# Patient Record
Sex: Male | Born: 1995 | Race: Black or African American | Hispanic: No | Marital: Single | Smoking: Never smoker
Health system: Southern US, Community
[De-identification: ages and names within clinical notes are randomized; demographics above are authoritative.]

## PROBLEM LIST (undated history)

## (undated) DIAGNOSIS — F329 Major depressive disorder, single episode, unspecified: Secondary | ICD-10-CM

## (undated) DIAGNOSIS — F4320 Adjustment disorder, unspecified: Secondary | ICD-10-CM

## (undated) DIAGNOSIS — F32A Depression, unspecified: Secondary | ICD-10-CM

## (undated) DIAGNOSIS — F419 Anxiety disorder, unspecified: Secondary | ICD-10-CM

## (undated) DIAGNOSIS — L83 Acanthosis nigricans: Secondary | ICD-10-CM

## (undated) DIAGNOSIS — F909 Attention-deficit hyperactivity disorder, unspecified type: Secondary | ICD-10-CM

## (undated) DIAGNOSIS — L732 Hidradenitis suppurativa: Secondary | ICD-10-CM

## (undated) HISTORY — PX: OTHER SURGICAL HISTORY: SHX169

---
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
## 2015-12-31 DIAGNOSIS — H5211 Myopia, right eye: Secondary | ICD-10-CM | POA: Diagnosis not present

## 2016-02-03 DIAGNOSIS — K649 Unspecified hemorrhoids: Secondary | ICD-10-CM | POA: Diagnosis not present

## 2016-02-03 DIAGNOSIS — K6289 Other specified diseases of anus and rectum: Secondary | ICD-10-CM | POA: Diagnosis not present

## 2016-03-22 DIAGNOSIS — R251 Tremor, unspecified: Secondary | ICD-10-CM | POA: Diagnosis not present

## 2016-03-22 DIAGNOSIS — L409 Psoriasis, unspecified: Secondary | ICD-10-CM | POA: Diagnosis not present

## 2016-03-22 DIAGNOSIS — Z6820 Body mass index (BMI) 20.0-20.9, adult: Secondary | ICD-10-CM | POA: Diagnosis not present

## 2016-04-02 DIAGNOSIS — N481 Balanitis: Secondary | ICD-10-CM | POA: Diagnosis not present

## 2016-06-02 DIAGNOSIS — R079 Chest pain, unspecified: Secondary | ICD-10-CM | POA: Diagnosis not present

## 2016-06-02 DIAGNOSIS — E860 Dehydration: Secondary | ICD-10-CM | POA: Diagnosis not present

## 2016-06-02 DIAGNOSIS — R197 Diarrhea, unspecified: Secondary | ICD-10-CM | POA: Diagnosis not present

## 2016-06-02 DIAGNOSIS — R112 Nausea with vomiting, unspecified: Secondary | ICD-10-CM | POA: Diagnosis not present

## 2016-07-02 DIAGNOSIS — J309 Allergic rhinitis, unspecified: Secondary | ICD-10-CM | POA: Diagnosis not present

## 2016-07-02 DIAGNOSIS — R5383 Other fatigue: Secondary | ICD-10-CM | POA: Diagnosis not present

## 2016-07-05 DIAGNOSIS — R143 Flatulence: Secondary | ICD-10-CM | POA: Diagnosis not present

## 2016-08-16 DIAGNOSIS — R51 Headache: Secondary | ICD-10-CM | POA: Diagnosis not present

## 2016-08-16 DIAGNOSIS — Z0000 Encounter for general adult medical examination without abnormal findings: Secondary | ICD-10-CM | POA: Diagnosis not present

## 2016-08-16 DIAGNOSIS — R109 Unspecified abdominal pain: Secondary | ICD-10-CM | POA: Diagnosis not present

## 2016-08-16 DIAGNOSIS — F4320 Adjustment disorder, unspecified: Secondary | ICD-10-CM | POA: Diagnosis not present

## 2016-08-17 DIAGNOSIS — R109 Unspecified abdominal pain: Secondary | ICD-10-CM | POA: Diagnosis not present

## 2016-08-17 DIAGNOSIS — Z0000 Encounter for general adult medical examination without abnormal findings: Secondary | ICD-10-CM | POA: Diagnosis not present

## 2016-10-27 DIAGNOSIS — F4329 Adjustment disorder with other symptoms: Secondary | ICD-10-CM | POA: Diagnosis not present

## 2016-10-27 DIAGNOSIS — F4320 Adjustment disorder, unspecified: Secondary | ICD-10-CM | POA: Insufficient documentation

## 2016-11-24 DIAGNOSIS — F4329 Adjustment disorder with other symptoms: Secondary | ICD-10-CM | POA: Diagnosis not present

## 2016-12-20 DIAGNOSIS — N419 Inflammatory disease of prostate, unspecified: Secondary | ICD-10-CM | POA: Diagnosis not present

## 2016-12-20 DIAGNOSIS — R109 Unspecified abdominal pain: Secondary | ICD-10-CM | POA: Diagnosis not present

## 2016-12-20 DIAGNOSIS — R0602 Shortness of breath: Secondary | ICD-10-CM | POA: Diagnosis not present

## 2016-12-20 DIAGNOSIS — L309 Dermatitis, unspecified: Secondary | ICD-10-CM | POA: Diagnosis not present

## 2016-12-29 DIAGNOSIS — B3783 Candidal cheilitis: Secondary | ICD-10-CM | POA: Diagnosis not present

## 2016-12-29 DIAGNOSIS — L2089 Other atopic dermatitis: Secondary | ICD-10-CM | POA: Diagnosis not present

## 2016-12-29 DIAGNOSIS — L209 Atopic dermatitis, unspecified: Secondary | ICD-10-CM | POA: Insufficient documentation

## 2016-12-29 DIAGNOSIS — D485 Neoplasm of uncertain behavior of skin: Secondary | ICD-10-CM | POA: Diagnosis not present

## 2016-12-31 DIAGNOSIS — Z719 Counseling, unspecified: Secondary | ICD-10-CM | POA: Diagnosis not present

## 2016-12-31 DIAGNOSIS — R140 Abdominal distension (gaseous): Secondary | ICD-10-CM | POA: Diagnosis not present

## 2017-01-11 DIAGNOSIS — K449 Diaphragmatic hernia without obstruction or gangrene: Secondary | ICD-10-CM | POA: Diagnosis not present

## 2017-01-11 DIAGNOSIS — R198 Other specified symptoms and signs involving the digestive system and abdomen: Secondary | ICD-10-CM | POA: Diagnosis not present

## 2017-03-14 DIAGNOSIS — R1084 Generalized abdominal pain: Secondary | ICD-10-CM | POA: Diagnosis not present

## 2017-03-14 DIAGNOSIS — R112 Nausea with vomiting, unspecified: Secondary | ICD-10-CM | POA: Diagnosis not present

## 2017-03-29 DIAGNOSIS — J029 Acute pharyngitis, unspecified: Secondary | ICD-10-CM | POA: Diagnosis not present

## 2017-03-29 DIAGNOSIS — J00 Acute nasopharyngitis [common cold]: Secondary | ICD-10-CM | POA: Diagnosis not present

## 2017-04-19 DIAGNOSIS — K219 Gastro-esophageal reflux disease without esophagitis: Secondary | ICD-10-CM | POA: Diagnosis not present

## 2017-04-28 DIAGNOSIS — K219 Gastro-esophageal reflux disease without esophagitis: Secondary | ICD-10-CM | POA: Insufficient documentation

## 2017-05-06 DIAGNOSIS — K0889 Other specified disorders of teeth and supporting structures: Secondary | ICD-10-CM | POA: Diagnosis not present

## 2017-05-06 DIAGNOSIS — Z6826 Body mass index (BMI) 26.0-26.9, adult: Secondary | ICD-10-CM | POA: Diagnosis not present

## 2017-05-16 DIAGNOSIS — K219 Gastro-esophageal reflux disease without esophagitis: Secondary | ICD-10-CM | POA: Diagnosis not present

## 2017-05-17 DIAGNOSIS — R1084 Generalized abdominal pain: Secondary | ICD-10-CM | POA: Diagnosis not present

## 2017-05-17 DIAGNOSIS — E860 Dehydration: Secondary | ICD-10-CM | POA: Diagnosis not present

## 2017-05-17 DIAGNOSIS — K529 Noninfective gastroenteritis and colitis, unspecified: Secondary | ICD-10-CM | POA: Diagnosis not present

## 2017-05-18 ENCOUNTER — Emergency Department (HOSPITAL_COMMUNITY)
Admission: EM | Admit: 2017-05-18 | Discharge: 2017-05-18 | Disposition: A | Discharge status: Home / Self Care | Payer: BLUE CROSS/BLUE SHIELD | Attending: Emergency Medicine | Admitting: Emergency Medicine

## 2017-05-18 DIAGNOSIS — R45851 Suicidal ideations: Secondary | ICD-10-CM | POA: Insufficient documentation

## 2017-05-18 DIAGNOSIS — F4320 Adjustment disorder, unspecified: Secondary | ICD-10-CM | POA: Insufficient documentation

## 2017-05-18 DIAGNOSIS — Z79899 Other long term (current) drug therapy: Secondary | ICD-10-CM | POA: Diagnosis not present

## 2017-05-18 DIAGNOSIS — F4323 Adjustment disorder with mixed anxiety and depressed mood: Secondary | ICD-10-CM | POA: Diagnosis not present

## 2017-05-18 MED ORDER — HYDROXYZINE HCL 25 MG PO TABS: 25.0000 mg | ORAL_TABLET | Freq: Four times a day (QID) | ORAL | 0 refills | Status: DC

## 2017-05-18 NOTE — ED Notes (Signed)
MD discontinued lab orders.  Patient discharged to home.

## 2017-05-18 NOTE — ED Triage Notes (Signed)
Patient from [employer] who was being seen in their clinic for a stomach virus, and when asked if he had ever thought of hurting himself he said, "yes."  Patient also said he had knives at home, so provider said that he should come to the hospital.  Patient is voluntary at this time.  Calm and cooperative.

## 2017-05-18 NOTE — ED Provider Notes (Signed)
[facility]-EMERGENCY DEPT Provider Note  CSN: [id number] Arrival date & time: 05/18/17 1215  Chief Complaint(s) Suicidal  HPI Julian Paul is a 21 y.o. male who presents to the emergency department for fleeting suicidal thoughts.  Patient was recently diagnosed with adjustment disorder that is currently being treated with behavioral therapy.  Reports going to student health for GI symptoms and asked whether he has had thoughts of hurting himself to which she replied yes.  Here he is endorsing the thoughts but states that they are fleeting in nature.  States that from time to time he thinks it might be better if he was not around so he would not have to deal with stress of school and that however he denies any active suicidal ideation or plans.  He denies any homicidal ideations, auditory/visual hallucinations.  Reports that he already has an appointment with a psychiatrist.  HPI  Past Medical History No past medical history on file. There are no active problems to display for this patient.  Home Medication(s) Prior to Admission medications   Medication Sig Start Date End Date Taking? Authorizing Provider  acetaminophen (TYLENOL) 500 MG tablet Take 1,000 mg by mouth 3 (three) times daily as needed for fever.   Yes [provider]  Multiple Vitamin (MULTIVITAMIN) tablet Take 1 tablet by mouth daily.   Yes [provider]  omeprazole (PRILOSEC) 20 MG capsule Take 20 mg by mouth daily. 04/19/17  Yes [provider]  ondansetron (ZOFRAN-ODT) 8 MG disintegrating tablet Take 8 mg by mouth every 8 (eight) hours as needed for nausea/vomiting. 05/17/17  Yes [provider]  triamcinolone cream (KENALOG) 0.1 % Apply 1 application topically once a week.   Yes [provider]  Vitamin D, Ergocalciferol, (DRISDOL) 50000 units CAPS capsule Take 50,000 Units by mouth every 7 (seven) days.   Yes [provider]  hydrOXYzine  (ATARAX/VISTARIL) 25 MG tablet Take 1 tablet (25 mg total) by mouth every 6 (six) hours. 05/18/17   Nira Conn, MD                                                                                                                                    Past Surgical History The histories are not reviewed yet. Please review them in the "History" navigator section and refresh this SmartLink. Family History No family history on file.  Social History Social History   Tobacco Use  . Smoking status: Not on file  Substance Use Topics  . Alcohol use: Not on file  . Drug use: Not on file   Allergies Patient has no allergy information on record.  Review of Systems Review of Systems All other systems are reviewed and are negative for acute change except as noted in the HPI  Physical Exam Vital Signs  I have reviewed the triage vital signs There were no vitals taken for this visit.  Physical Exam  Constitutional: He is oriented to person, place, and time. He appears well-developed and well-nourished. No distress.  HENT:  Head: Normocephalic and atraumatic.  Right Ear: External ear normal.  Left Ear: External ear normal.  Nose: Nose normal.  Mouth/Throat: Mucous membranes are normal. No trismus in the jaw.  Eyes: Conjunctivae and EOM are normal. No scleral icterus.  Neck: Normal range of motion and phonation normal.  Cardiovascular: Normal rate and regular rhythm.  Pulmonary/Chest: Effort normal. No stridor. No respiratory distress.  Abdominal: He exhibits no distension.  Musculoskeletal: Normal range of motion. He exhibits no edema.  Neurological: He is alert and oriented to person, place, and time.  Skin: He is not diaphoretic.  Psychiatric: He has a normal mood and affect. His speech is normal and behavior is normal. Thought content normal. Cognition and memory are normal.  Vitals reviewed.   ED Results and Treatments Labs (all labs ordered are listed, but only abnormal  results are displayed) Labs Reviewed  COMPREHENSIVE METABOLIC PANEL  ETHANOL  SALICYLATE LEVEL  ACETAMINOPHEN LEVEL  CBC  RAPID URINE DRUG SCREEN, HOSP PERFORMED                                                                                                                         EKG  EKG Interpretation  Date/Time:    Ventricular Rate:    PR Interval:    QRS Duration:   QT Interval:    QTC Calculation:   R Axis:     Text Interpretation:        Radiology No results found. Pertinent labs & imaging results that were available during my care of the patient were reviewed by me and considered in my medical decision making (see chart for details).  Medications Ordered in ED Medications - No data to display                                                                                                                                  Procedures Procedures  (including critical care time)  Medical Decision Making / ED Course I have reviewed the nursing notes for this encounter and the patient's prior records (if available in EHR or on provided paperwork).    Patient does not appear to be a threat to himself or others.  He does have fleeting thoughts of suicide but no active plans.  He is reasonable and understands his situation  stating that he has been going to counseling but knows that he needs more assistance that is why he is set up an appointment with a psychiatrist.  I feel that the patient is safe for discharge and management as an outpatient.  We will provide the patient with Vistaril for anxiety.  The patient is safe for discharge with strict return precautions.   Final Clinical Impression(s) / ED Diagnoses Final diagnoses:  Suicidal thoughts    Disposition: Discharge  Condition: Good  I have discussed the results, Dx and Tx plan with the patient who expressed understanding and agree(s) with the plan. Discharge instructions discussed at great length. The patient was  given strict return precautions who verbalized understanding of the instructions. No further questions at time of discharge.    ED Discharge Orders        Ordered    hydrOXYzine (ATARAX/VISTARIL) 25 MG tablet  Every 6 hours     05/18/17 1328       Follow Up: Psychiatrist   As scheduled     This chart was dictated using voice recognition software.  Despite best efforts to proofread,  errors can occur which can change the documentation meaning.   Nira Connardama, Pedro Eduardo, MD 05/18/17 [phone]

## 2017-05-31 DIAGNOSIS — L209 Atopic dermatitis, unspecified: Secondary | ICD-10-CM | POA: Diagnosis not present

## 2017-06-20 DIAGNOSIS — F4323 Adjustment disorder with mixed anxiety and depressed mood: Secondary | ICD-10-CM | POA: Diagnosis not present

## 2017-08-01 DIAGNOSIS — F411 Generalized anxiety disorder: Secondary | ICD-10-CM | POA: Diagnosis not present

## 2017-08-22 ENCOUNTER — Encounter: Payer: Self-pay | Admitting: Physician Assistant

## 2017-08-22 DIAGNOSIS — R81 Glycosuria: Secondary | ICD-10-CM | POA: Diagnosis not present

## 2017-08-22 LAB — BASIC METABOLIC PANEL
BUN: 19 (ref 4–21)
Creatinine: 1 (ref 0.6–1.3)
GLUCOSE: 85
Potassium: 4.3 (ref 3.4–5.3)
SODIUM: 137 (ref 137–147)

## 2017-08-22 LAB — HEMOGLOBIN A1C: Hgb A1c MFr Bld: 5.6 (ref 4.0–6.0)

## 2017-09-02 DIAGNOSIS — F4323 Adjustment disorder with mixed anxiety and depressed mood: Secondary | ICD-10-CM | POA: Diagnosis not present

## 2017-10-07 ENCOUNTER — Ambulatory Visit (INDEPENDENT_AMBULATORY_CARE_PROVIDER_SITE_OTHER): Payer: BLUE CROSS/BLUE SHIELD | Admitting: Family Medicine

## 2017-10-07 ENCOUNTER — Encounter: Payer: Self-pay | Admitting: Family Medicine

## 2017-10-07 ENCOUNTER — Ambulatory Visit (INDEPENDENT_AMBULATORY_CARE_PROVIDER_SITE_OTHER): Payer: BLUE CROSS/BLUE SHIELD

## 2017-10-07 VITALS — BP 118/64 | HR 77 | Temp 98.5°F | Ht 77.0 in | Wt 224.6 lb

## 2017-10-07 DIAGNOSIS — Z114 Encounter for screening for human immunodeficiency virus [HIV]: Secondary | ICD-10-CM | POA: Diagnosis not present

## 2017-10-07 DIAGNOSIS — M25552 Pain in left hip: Secondary | ICD-10-CM | POA: Diagnosis not present

## 2017-10-07 DIAGNOSIS — M25561 Pain in right knee: Secondary | ICD-10-CM

## 2017-10-07 DIAGNOSIS — R5383 Other fatigue: Secondary | ICD-10-CM | POA: Diagnosis not present

## 2017-10-07 DIAGNOSIS — Z0001 Encounter for general adult medical examination with abnormal findings: Secondary | ICD-10-CM

## 2017-10-07 DIAGNOSIS — Z0000 Encounter for general adult medical examination without abnormal findings: Secondary | ICD-10-CM

## 2017-10-07 DIAGNOSIS — M25562 Pain in left knee: Secondary | ICD-10-CM

## 2017-10-07 DIAGNOSIS — R0789 Other chest pain: Secondary | ICD-10-CM

## 2017-10-07 DIAGNOSIS — M25551 Pain in right hip: Secondary | ICD-10-CM

## 2017-10-07 DIAGNOSIS — Z23 Encounter for immunization: Secondary | ICD-10-CM

## 2017-10-07 DIAGNOSIS — M170 Bilateral primary osteoarthritis of knee: Secondary | ICD-10-CM | POA: Diagnosis not present

## 2017-10-07 LAB — COMPREHENSIVE METABOLIC PANEL
ALT: 16 U/L (ref 0–53)
AST: 19 U/L (ref 0–37)
Albumin: 4.5 g/dL (ref 3.5–5.2)
Alkaline Phosphatase: 78 U/L (ref 39–117)
BUN: 12 mg/dL (ref 6–23)
CO2: 30 mEq/L (ref 19–32)
Calcium: 9.7 mg/dL (ref 8.4–10.5)
Chloride: 100 mEq/L (ref 96–112)
Creatinine, Ser: 0.93 mg/dL (ref 0.40–1.50)
GFR: 131.05 mL/min (ref >60.00)
Glucose, Bld: 72 mg/dL (ref 70–99)
Potassium: 4.5 mEq/L (ref 3.5–5.1)
Sodium: 138 mEq/L (ref 135–145)
Total Bilirubin: 0.4 mg/dL (ref 0.2–1.2)
Total Protein: 7.9 g/dL (ref 6.0–8.3)

## 2017-10-07 LAB — CBC WITH DIFFERENTIAL/PLATELET
Basophils Absolute: 0 10*3/uL (ref 0.0–0.1)
Basophils Relative: 0.2 % (ref 0.0–3.0)
Eosinophils Absolute: 0 10*3/uL (ref 0.0–0.7)
Eosinophils Relative: 0.7 % (ref 0.0–5.0)
HCT: 46.1 % (ref 39.0–52.0)
Hemoglobin: 15.4 g/dL (ref 13.0–17.0)
Lymphocytes Relative: 27.4 % (ref 12.0–46.0)
Lymphs Abs: 1.7 10*3/uL (ref 0.7–4.0)
MCHC: 33.4 g/dL (ref 30.0–36.0)
MCV: 86 fl (ref 78.0–100.0)
Monocytes Absolute: 0.7 10*3/uL (ref 0.1–1.0)
Monocytes Relative: 11.4 % (ref 3.0–12.0)
Neutro Abs: 3.7 10*3/uL (ref 1.4–7.7)
Neutrophils Relative %: 60.3 % (ref 43.0–77.0)
Platelets: 235 10*3/uL (ref 150.0–400.0)
RBC: 5.36 Mil/uL (ref 4.22–5.81)
RDW: 14 % (ref 11.5–15.5)
WBC: 6.1 10*3/uL (ref 4.0–10.5)

## 2017-10-07 LAB — TSH: TSH: 0.73 u[IU]/mL (ref 0.35–4.50)

## 2017-10-07 LAB — MAGNESIUM: Magnesium: 1.9 mg/dL (ref 1.5–2.5)

## 2017-10-07 LAB — CK: Total CK: 150 U/L (ref 7–232)

## 2017-10-07 MED ORDER — TETANUS-DIPHTH-ACELL PERTUSSIS 5-2.5-18.5 LF-MCG/0.5 IM SUSP: 0.5000 mL | Freq: Once | INTRAMUSCULAR | Status: DC

## 2017-10-07 NOTE — Progress Notes (Signed)
Julian Paul is a 22 y.o. [facility]   Patient Care Team: Helane RimaWallace, Cadie Sorci, DO as PCP - General (Family Medicine)   History of Present Illness:   Barnie MortJoEllen Thompson, CMA acting as scribe for Dr. Helane RimaErica Borghild Thaker.   HPI: See Assessment and Plan section for Problem Based Charting of issues discussed today.  Patient here to establish care. He has had increased joint pain for several years but has increased in the last few months. Would also like new referral to see dermatology.   Health Maintenance Due  Topic Date Due  . TETANUS/TDAP  02/02/2015   Depression screen PHQ 2/9 10/07/2017  Decreased Interest 0  Down, Depressed, Hopeless 0  PHQ - 2 Score 0  Altered sleeping 1  Tired, decreased energy 0  Change in appetite 0  Feeling bad or failure about yourself  0  Trouble concentrating 0  Moving slowly or fidgety/restless 3  Suicidal thoughts 0  PHQ-9 Score 4  Difficult doing work/chores Somewhat difficult   PMHx, SurgHx, SocialHx, Medications, and Allergies were reviewed in the Visit Navigator and updated as appropriate.   History reviewed. No pertinent past medical history. History reviewed. No pertinent surgical history. History reviewed. No pertinent family history.   Social History   Tobacco Use  . Smoking status: Never Smoker  . Smokeless tobacco: Never Used  Substance Use Topics  . Alcohol use: Not on file  . Drug use: Not on file    Current Medications and Allergies:   .  acetaminophen (TYLENOL) 500 MG tablet, Take 1,000 mg by mouth 3 (three) times daily as needed for fever., Disp: , Rfl:  .  hydrocortisone 2.5 % cream, Apply topically., Disp: , Rfl:  .  hydrOXYzine (ATARAX/VISTARIL) 25 MG tablet, Take 1 tablet (25 mg total) by mouth every 6 (six) hours. (Patient taking differently: Take 50 mg by mouth every 6 (six) hours. ), Disp: 12 tablet, Rfl: 0 .  Multiple Vitamin (MULTIVITAMIN) tablet, Take 1 tablet by mouth daily., Disp: , Rfl:  .  sertraline  (ZOLOFT) 100 MG tablet, Take 100 mg by mouth daily., Disp: , Rfl:   No Known Allergies   Review of Systems:   Pertinent items are noted in the HPI. Otherwise, ROS is negative.  Vitals:   Vitals:   10/07/17 1044  BP: 118/64  Pulse: 77  Temp: 98.5 F (36.9 C)  TempSrc: Oral  SpO2: 97%  Weight: 224 lb 9.6 oz (101.9 kg)  Height: 6\' 5"  (1.956 m)     Body mass index is 26.63 kg/m.  Physical Exam:   Physical Exam  Constitutional: He is oriented to person, place, and time. He appears well-developed and well-nourished. No distress.  HENT:  Head: Normocephalic and atraumatic.  Right Ear: External ear normal.  Left Ear: External ear normal.  Nose: Nose normal.  Mouth/Throat: Oropharynx is clear and moist.  Eyes: Pupils are equal, round, and reactive to light. Conjunctivae and EOM are normal.  Neck: Normal range of motion. Neck supple.  Cardiovascular: Normal rate, regular rhythm, normal heart sounds and intact distal pulses.  Pulmonary/Chest: Effort normal and breath sounds normal.  Abdominal: Soft. Bowel sounds are normal.  Musculoskeletal:       Right hip: He exhibits decreased range of motion and tenderness.       Left hip: He exhibits decreased range of motion and tenderness.       Right knee: Tenderness found. Medial joint line tenderness noted.  Left knee: Tenderness found. Medial joint line tenderness noted.  Neurological: He is alert and oriented to person, place, and time.  Skin: Skin is warm and dry.  Psychiatric: He has a normal mood and affect. His behavior is normal. Judgment and thought content normal.  Nursing note and vitals reviewed.  Assessment and Plan:   1. Encounter for screening for HIV - HIV antibody  2. Need for Tdap vaccination - Tdap (BOOSTRIX) injection 0.5 mL  3. Pain in both knees, chronic - DG Knee 1-2 Views Left; Future - DG Knee 1-2 Views Right; Future  4. Pain of both hip joints - DG HIPS BILAT W OR W/O PELVIS MIN 5 VIEWS;  Future  5. Fatigue, unspecified type - CBC with Differential/Platelet - Comprehensive metabolic panel - TSH - Magnesium - CK  6. Atypical chest pain ECHO ordered.   7. Routine physical examination  Patient Counseling: [x]   Nutrition: Stressed importance of moderation in sodium/caffeine intake, saturated fat and cholesterol, caloric balance, sufficient intake of fresh fruits, vegetables, and fiber.  [x]   Stressed the importance of regular exercise.   []   Substance Abuse: Discussed cessation/primary prevention of tobacco, alcohol, or other drug use; driving or other dangerous activities under the influence; availability of treatment for abuse.   [x]   Injury prevention: Discussed safety belts, safety helmets, smoke detector, smoking near bedding or upholstery.   []   Sexuality: Discussed sexually transmitted diseases, partner selection, use of condoms, avoidance of unintended pregnancy and contraceptive alternatives.   [x]   Dental health: Discussed importance of regular tooth brushing, flossing, and dental visits.  [x]   Health maintenance and immunizations reviewed. Please refer to Health maintenance section.     . Reviewed expectations re: course of current medical issues. . Discussed self-management of symptoms. . Outlined signs and symptoms indicating need for more acute intervention. . Patient verbalized understanding and all questions were answered. Marland Kitchen Health Maintenance issues including appropriate healthy diet, exercise, and smoking avoidance were discussed with patient. . See orders for this visit as documented in the electronic medical record. . Patient received an After Visit Summary.  CMA served as Neurosurgeon during this visit. History, Physical, and Plan performed by medical provider. The above documentation has been reviewed and is accurate and complete. Helane Rima, D.O.  Helane Rima, DO [place] 10/15/2017

## 2017-10-08 LAB — HIV ANTIBODY (ROUTINE TESTING W REFLEX): HIV 1&2 Ab, 4th Generation: NONREACTIVE

## 2017-10-24 ENCOUNTER — Other Ambulatory Visit: Payer: Self-pay

## 2017-10-24 ENCOUNTER — Ambulatory Visit (HOSPITAL_COMMUNITY): Payer: BLUE CROSS/BLUE SHIELD | Attending: Family Medicine

## 2017-10-24 ENCOUNTER — Telehealth: Payer: Self-pay | Admitting: Family Medicine

## 2017-10-24 DIAGNOSIS — M25551 Pain in right hip: Secondary | ICD-10-CM

## 2017-10-24 DIAGNOSIS — R0789 Other chest pain: Secondary | ICD-10-CM

## 2017-10-24 DIAGNOSIS — I071 Rheumatic tricuspid insufficiency: Secondary | ICD-10-CM | POA: Diagnosis not present

## 2017-10-24 DIAGNOSIS — M25552 Pain in left hip: Principal | ICD-10-CM

## 2017-10-24 NOTE — Telephone Encounter (Signed)
Acutely, ice and use ACE bandage. Let's get him into [facility] and/or PT.

## 2017-10-24 NOTE — Telephone Encounter (Signed)
Called patient gave message. Made app with rigby for this week.

## 2017-10-24 NOTE — Telephone Encounter (Signed)
Please advise 

## 2017-10-24 NOTE — Telephone Encounter (Signed)
See note

## 2017-10-24 NOTE — Telephone Encounter (Signed)
Copied from CRM [id number]. Topic: Quick Communication - See Telephone Encounter >> Oct 24, 2017 12:44 PM Lorrine Kin, NT wrote: CRM for notification. See Telephone encounter for: 10/24/17. 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 States he is squatting and lifting boxes for 7-8 hours a day. He said he worked 7.5 hour shift yesterday and had some intense burning pain in right knee. Wants to know what he can immediately do help this?  States that he has to be careful taking OTC medications due to acid reflux and stomach sensitivity. Please advise. CB#: [phone] can leave a voicemail if no answer.

## 2017-10-26 ENCOUNTER — Ambulatory Visit: Payer: BLUE CROSS/BLUE SHIELD | Admitting: Sports Medicine

## 2017-10-26 ENCOUNTER — Encounter: Payer: Self-pay | Admitting: Sports Medicine

## 2017-10-26 VITALS — BP 110/78 | HR 76 | Ht 77.0 in | Wt 230.6 lb

## 2017-10-26 DIAGNOSIS — M24559 Contracture, unspecified hip: Secondary | ICD-10-CM

## 2017-10-26 DIAGNOSIS — R29898 Other symptoms and signs involving the musculoskeletal system: Secondary | ICD-10-CM | POA: Diagnosis not present

## 2017-10-26 DIAGNOSIS — M25561 Pain in right knee: Secondary | ICD-10-CM

## 2017-10-26 DIAGNOSIS — G8929 Other chronic pain: Secondary | ICD-10-CM

## 2017-10-26 DIAGNOSIS — M25562 Pain in left knee: Secondary | ICD-10-CM | POA: Diagnosis not present

## 2017-10-26 DIAGNOSIS — M25552 Pain in left hip: Secondary | ICD-10-CM

## 2017-10-26 DIAGNOSIS — M25551 Pain in right hip: Secondary | ICD-10-CM

## 2017-10-26 NOTE — Patient Instructions (Addendum)
Also check out [employer]" which is a program developed by Dr. Myles Lipps.   There are links to a couple of his YouTube Videos below and I would like to see performing one of his videos 5-6 days per week.    A good intro video is: "Independence from Pain 7-minute Video" - [email or web address]   His more advanced video is: "Powerful Posture and Pain Relief: 12 minutes of Foundation Training" - https://youtu.be/[email or web address]  Do not try to attempt this entire video when first beginning.    Try breaking of each exercise that he goes into shorter segments.  Otherwise if they perform an exercise for 45 seconds, start with 15 seconds and rest and then resume when they begin the new activity.    If you work your way up to doing this 12 minute video, I expect you will see significant improvements in your pain.  If you enjoy his videos and would like to find out more you can look on his website: [email or web address].  He has a workout streaming option as well as a DVD set available for purchase.  Amazon has the best price for his DVDs.     Please perform the exercise program that we have prepared for you and gone over in detail on a daily basis.  In addition to the handout you were provided you can access your program through: [email or web address]   Your unique program code is: [email or web address]

## 2017-10-26 NOTE — Progress Notes (Signed)
PROCEDURE NOTE: THERAPEUTIC EXERCISES (97110) 15 minutes spent for Therapeutic exercises as below and as referenced in the AVS.  This included exercises focusing on stretching, strengthening, with significant focus on eccentric aspects.   Proper technique shown and discussed handout in great detail with ATC.  All questions were discussed and answered.   Long term goals include an improvement in range of motion, strength, endurance as well as avoiding reinjury. Frequency of visits is one time as determined during today's  office visit. Frequency of exercises to be performed is as per handout.  EXERCISES REVIEWED: Derrill Kay Exercises Hip ABduction strengthening with focus on Glute Medius Recruitment VMO Strengthening core stabilization

## 2017-10-26 NOTE — Progress Notes (Signed)
[name]. [name] [phone]  Julian Paul - 22 y.o. male MRN [id number]  Date of birth: 1995/08/28  Visit Date: 10/26/2017  PCP: Helane Rima, DO   Referred by: Helane Rima, DO  Scribe for today's visit: Christoper Fabian, LAT, ATC     SUBJECTIVE:  Julian Paul is here for New Patient (Initial Visit) (B hip pain) .  Referred by: Dr. Earlene Plater His B hip and knee pain (R>L)symptoms INITIALLY: Began years ago w/ no known MOI.  Works as a Nature conservation officer at [employer]. Described as mild-severe depending on activity level that can be sharp at times, nonradiating Worsened with loaded deep knee flexion and transitioning from floor to stand; squat Improved with ice and elevation Additional associated symptoms include: intermittent swelling and popping/cracking in knees    At this time symptoms are worsening compared to onset w/ increased pain in knees and hips He has been using ice and elevation but has not used any medication due to digestive sensitivity.  Had B knee and hip XR on 10/07/17  ROS Reports night time disturbances. Denies fevers, chills, or night sweats. Denies unexplained weight loss. Denies personal history of cancer. Denies changes in bowel or bladder habits. Denies recent unreported falls. Denies new or worsening dyspnea or wheezing. Reports headaches or dizziness. Yes to headaches Denies numbness, tingling or weakness  In the extremities -  Denies dizziness or presyncopal episodes Denies lower extremity edema    HISTORY & PERTINENT PRIOR DATA:  Prior History reviewed and updated per electronic medical record.  Significant/pertinent history, findings, studies include:  reports that he has never smoked. He has never used smokeless tobacco. No results for input(s): HGBA1C, LABURIC, CREATINE in the last 8760 hours. No specialty comments available. No problems updated.  OBJECTIVE:  VS:  HT:6\' 5"  (195.6 cm)    WT:230 lb 9.6 oz (104.6 kg)  BMI:27.34    BP:110/78  HR:76bpm  TEMP: ( )  RESP:97 %   PHYSICAL EXAM: Constitutional: WDWN, Non-toxic appearing. Psychiatric: Alert & appropriately interactive.  Not depressed or anxious appearing. Respiratory: No increased work of breathing.  Trachea Midline Eyes: Pupils are equal.  EOM intact without nystagmus.  No scleral icterus  Vascular Exam: warm to touch no edema  lower extremity neuro exam: unremarkable normal strength normal sensation normal reflexes  MSK Exam: Bilateral lower extremities overall well aligned without significant deformity.  She has markedly tight hip flexors bilaterally with a hip abduction strength 4 out of 5.  She has poor VMO definition.  Lateral tracking of the patella.  Her knees are ligamentously stable.  Negative McMurray's.   ASSESSMENT & PLAN:   1. Chronic pain of both knees   2. Pain of both hip joints   3. Hip flexor tightness, unspecified laterality   4. Weakness of both hips     PLAN: Discussed the foundation of treatment for this condition is physical therapy and/or daily (5-6 days/week) therapeutic exercises, focusing on core strengthening, coordination, neuromuscular control/reeducation.  Therapeutic exercises prescribed per procedure note.  Links to [employer] provided today per Patient Instructions.  These exercises were developed by Myles Lipps, DC with a strong emphasis on core neuromuscular reducation and postural realignment through body-weight exercises.   Follow-up: Return in about 6 weeks (around 12/07/2017), or if symptoms worsen or fail to improve.      Please see additional documentation for Objective, Assessment and Plan sections. Pertinent additional documentation may  be included in corresponding procedure notes, imaging studies, problem based documentation and patient instructions. Please see these sections of the encounter for additional information regarding this  visit.  CMA/ATC served as Neurosurgeon during this visit. History, Physical, and Plan performed by medical provider. Documentation and orders reviewed and attested to.      [name], DO    [place] [facility] Physician

## 2017-11-10 ENCOUNTER — Encounter: Payer: Self-pay | Admitting: Sports Medicine

## 2017-11-14 NOTE — Progress Notes (Signed)
Julian Paul is a 22 y.o. male is here for follow up.  History of Present Illness:   Britt Bottom CMA acting as scribe for Dr. Earlene Plater.  HPI: Patient comes in today for an acute visit. He is a Production designer, theatre/television/film person. He was unloading boxes about a week ago and when he grip a box he felt his right middle hand pop. He has been able to work, but when he grips is when he feels pain. We have given him a letter with restrictions for two weeks. He will get a thumb spica from the drug store.   Health Maintenance Due  Topic Date Due  . TETANUS/TDAP  02/02/2015   Depression screen PHQ 2/9 10/07/2017  Decreased Interest 0  Down, Depressed, Hopeless 0  PHQ - 2 Score 0  Altered sleeping 1  Tired, decreased energy 0  Change in appetite 0  Feeling bad or failure about yourself  0  Trouble concentrating 0  Moving slowly or fidgety/restless 3  Suicidal thoughts 0  PHQ-9 Score 4  Difficult doing work/chores Somewhat difficult   PMHx, SurgHx, SocialHx, FamHx, Medications, and Allergies were reviewed in the Visit Navigator and updated as appropriate.   Patient Active Problem List   Diagnosis Date Noted  . Atopic eczema 12/29/2016  . Adjustment disorder 10/27/2016  . Acne 06/26/2010   Social History   Tobacco Use  . Smoking status: Never Smoker  . Smokeless tobacco: Never Used  Substance Use Topics  . Alcohol use: Not on file  . Drug use: Not on file   Current Medications and Allergies:   .  acetaminophen (TYLENOL) 500 MG tablet, Take 1,000 mg by mouth 3 (three) times daily as needed for fever., Disp: , Rfl:  .  hydrocortisone 2.5 % cream, Apply topically., Disp: , Rfl:  .  hydrOXYzine (ATARAX/VISTARIL) 25 MG tablet, Take 1 tablet (25 mg total) by mouth every 6 (six) hours. (Patient taking differently: Take 50 mg by mouth every 6 (six) hours. ), Disp: 12 tablet, Rfl: 0 .  Multiple Vitamin (MULTIVITAMIN) tablet, Take 1 tablet by mouth daily., Disp: , Rfl:  .  sertraline (ZOLOFT) 100  MG tablet, Take 100 mg by mouth daily., Disp: , Rfl:  .  triamcinolone cream (KENALOG) 0.1 %, Apply 1 application topically once a week., Disp: , Rfl:   No Known Allergies   Review of Systems   Pertinent items are noted in the HPI. Otherwise, ROS is negative.  Vitals:   Vitals:   11/15/17 1402  BP: 112/72  Pulse: 94  Temp: 98.2 F (36.8 C)  TempSrc: Oral  SpO2: 97%  Weight: 236 lb (107 kg)  Height: 6\' 5"  (1.956 m)     Body mass index is 27.99 kg/m.  Physical Exam:   Physical Exam  Constitutional: He is oriented to person, place, and time. He appears well-developed and well-nourished. No distress.  HENT:  Head: Normocephalic and atraumatic.  Right Ear: External ear normal.  Left Ear: External ear normal.  Nose: Nose normal.  Mouth/Throat: Oropharynx is clear and moist.  Eyes: Pupils are equal, round, and reactive to light. Conjunctivae and EOM are normal.  Neck: Normal range of motion. Neck supple.  Cardiovascular: Normal rate, regular rhythm, normal heart sounds and intact distal pulses.  Pulmonary/Chest: Effort normal and breath sounds normal.  Abdominal: Soft. Bowel sounds are normal.  Musculoskeletal:       Left hand: He exhibits decreased range of motion and bony tenderness. He exhibits normal  capillary refill, no deformity, no laceration and no swelling. Normal sensation noted. Decreased strength noted. He exhibits thumb/finger opposition.       Hands: Neurological: He is alert and oriented to person, place, and time.  Skin: Skin is warm and dry.  Psychiatric: He has a normal mood and affect. His behavior is normal. Judgment and thought content normal.  Nursing note and vitals reviewed.  Results for orders placed or performed in visit on 10/07/17  HIV antibody  Result Value Ref Range   HIV 1&2 Ab, 4th Generation NON-REACTIVE NON-REACTI  CBC with Differential/Platelet  Result Value Ref Range   WBC 6.1 4.0 - 10.5 K/uL   RBC 5.36 4.22 - 5.81 Mil/uL    Hemoglobin 15.4 13.0 - 17.0 g/dL   HCT 40.9 81.1 - 91.4 %   MCV 86.0 78.0 - 100.0 fl   MCHC 33.4 30.0 - 36.0 g/dL   RDW 78.2 95.6 - 21.3 %   Platelets 235.0 150.0 - 400.0 K/uL   Neutrophils Relative % 60.3 43.0 - 77.0 %   Lymphocytes Relative 27.4 12.0 - 46.0 %   Monocytes Relative 11.4 3.0 - 12.0 %   Eosinophils Relative 0.7 0.0 - 5.0 %   Basophils Relative 0.2 0.0 - 3.0 %   Neutro Abs 3.7 1.4 - 7.7 K/uL   Lymphs Abs 1.7 0.7 - 4.0 K/uL   Monocytes Absolute 0.7 0.1 - 1.0 K/uL   Eosinophils Absolute 0.0 0.0 - 0.7 K/uL   Basophils Absolute 0.0 0.0 - 0.1 K/uL  Comprehensive metabolic panel  Result Value Ref Range   Sodium 138 135 - 145 mEq/L   Potassium 4.5 3.5 - 5.1 mEq/L   Chloride 100 96 - 112 mEq/L   CO2 30 19 - 32 mEq/L   Glucose, Bld 72 70 - 99 mg/dL   BUN 12 6 - 23 mg/dL   Creatinine, Ser 0.86 0.40 - 1.50 mg/dL   Total Bilirubin 0.4 0.2 - 1.2 mg/dL   Alkaline Phosphatase 78 39 - 117 U/L   AST 19 0 - 37 U/L   ALT 16 0 - 53 U/L   Total Protein 7.9 6.0 - 8.3 g/dL   Albumin 4.5 3.5 - 5.2 g/dL   Calcium 9.7 8.4 - 57.8 mg/dL   GFR 469.62 >95.28 mL/min  TSH  Result Value Ref Range   TSH 0.73 0.35 - 4.50 uIU/mL  Magnesium  Result Value Ref Range   Magnesium 1.9 1.5 - 2.5 mg/dL  CK  Result Value Ref Range   Total CK 150 7 - 232 U/L    Assessment and Plan:   Kiante was seen today for hand pain.  Diagnoses and all orders for this visit:  Sprain of right thumb, initial encounter    . Reviewed expectations re: course of current medical issues. . Discussed self-management of symptoms. . Outlined signs and symptoms indicating need for more acute intervention. . Patient verbalized understanding and all questions were answered. Marland Kitchen Health Maintenance issues including appropriate healthy diet, exercise, and smoking avoidance were discussed with patient. . See orders for this visit as documented in the electronic medical record. . Patient received an After Visit  Summary.  Helane Rima, DO [place] 11/22/2017  Future Appointments  Date Time Provider Department Center  12/07/2017  3:00 PM [name], DO [facility]  04/10/2018  9:00 AM Helane Rima, DO [facility]   CMA served as scribe during this visit. History, Physical, and Plan performed by medical provider. The  above documentation has been reviewed and is accurate and complete. Helane RimaErica Gurvir Schrom, D.O.

## 2017-11-15 ENCOUNTER — Encounter: Payer: Self-pay | Admitting: Surgical

## 2017-11-15 ENCOUNTER — Encounter: Payer: Self-pay | Admitting: Family Medicine

## 2017-11-15 ENCOUNTER — Ambulatory Visit: Payer: BLUE CROSS/BLUE SHIELD | Admitting: Family Medicine

## 2017-11-15 VITALS — BP 112/72 | HR 94 | Temp 98.2°F | Ht 77.0 in | Wt 236.0 lb

## 2017-11-15 DIAGNOSIS — N390 Urinary tract infection, site not specified: Secondary | ICD-10-CM | POA: Diagnosis not present

## 2017-11-15 DIAGNOSIS — S63601A Unspecified sprain of right thumb, initial encounter: Secondary | ICD-10-CM | POA: Diagnosis not present

## 2017-11-18 ENCOUNTER — Encounter: Payer: Self-pay | Admitting: Family Medicine

## 2017-11-21 ENCOUNTER — Other Ambulatory Visit: Payer: Self-pay | Admitting: Surgical

## 2017-11-21 DIAGNOSIS — R238 Other skin changes: Secondary | ICD-10-CM

## 2017-11-22 ENCOUNTER — Encounter: Payer: Self-pay | Admitting: Family Medicine

## 2017-11-24 ENCOUNTER — Ambulatory Visit: Payer: BLUE CROSS/BLUE SHIELD | Admitting: Family Medicine

## 2017-11-24 ENCOUNTER — Encounter: Payer: Self-pay | Admitting: Family Medicine

## 2017-11-24 ENCOUNTER — Telehealth: Payer: Self-pay

## 2017-11-24 VITALS — BP 112/74 | HR 76 | Temp 98.6°F | Ht 77.0 in | Wt 235.0 lb

## 2017-11-24 DIAGNOSIS — L732 Hidradenitis suppurativa: Secondary | ICD-10-CM

## 2017-11-24 MED ORDER — CLINDAMYCIN PHOSPHATE 1 % EX GEL: Freq: Two times a day (BID) | CUTANEOUS | 5 refills | Status: DC

## 2017-11-24 NOTE — Telephone Encounter (Signed)
Triage  ----- Message -----  From: Aretta Niprawford, Mary M  Sent: 11/23/2017  9:50 PM  To[facility]  Subject: FW: Appointment scheduled from MyChart            ----- Message -----  From: Julian ColonelAri H Bache  Sent: 11/23/2017  9:14 PM  To: [facility]  Subject: Appointment scheduled from MyChart          Appointment For: Julian Paul ([id number])  Visit Type: MYCHART OFFICE VISIT (1064)    11/25/2017  3:20 PM 20 mins. Helane RimaWallace, Erica, DO    [facility]    Patient Comments:  Have a wound where the skin is pulled apart... originated from ingrown hair in pubic region. Hurts when touched and aches sometimes on its own. Wound is 1 inch long by 0.5 inches wide, and has been open for over 72 hours without scabbing over.     I have called patient. Has had lesion for 4 days it stared 4 days ago with discharge. He does have redness and swelling in area. No fever, N&V, no urinary symptoms. Genitalia is not involved in lesion or swelling but is more tender. Has had history of similar issues in the past but nothing this bad.  I have made app with Artis FlockWolfe today at 1 pm

## 2017-11-24 NOTE — Progress Notes (Signed)
Patient: Julian Paul H Jafri MRN: [id number] DOB: [birth year] PCP: Helane RimaWallace, Erica, DO     Subjective:  Chief Complaint  Patient presents with  . Sore    in groin x 4 days     HPI: The patient is a 22 y.o. male who presents today for evaluation for sore in groin area. No problems with urination. Some swelling and redness. Started as two small bumps in pelvic area now is on large open wound with discharge. Started four days ago and progressively worse. He states he will occasionally get these "sores" in his pubic area, but this was larger than normal and ruptured on Sunday after he scratched it. It drained a "diluted blood" color. Since that time, the wound does not seem to be healing. They usually scab and close. He does shave at times his pubic hair. He didn't shave before this time. No fever/chills. No sores anywhere else on body. Unsure of family history.   Review of Systems  Constitutional: Negative for appetite change, chills and fever.  HENT: Positive for sinus pressure and sinus pain.        Started in the first of the week much better now.   Eyes: Negative for discharge and itching.  Respiratory: Positive for cough. Negative for chest tightness.   Gastrointestinal: Negative for blood in stool and constipation.  Endocrine: Negative for cold intolerance and heat intolerance.  Genitourinary: Negative for difficulty urinating, dysuria, flank pain, hematuria, penile swelling and scrotal swelling.  Musculoskeletal: Negative for back pain.  Skin: Positive for color change and wound.       Light yellow to tan color discharge very small amounts    Neurological: Negative for dizziness and headaches.    Allergies Patient has No Known Allergies.  Past Medical History Patient  has no past medical history on file.  Surgical History Patient  has a past surgical history that includes undescended testes.  Family History Pateint's family history includes Cancer - Other in his father.  Social  History Patient  reports that he has never smoked. He has never used smokeless tobacco.    Objective: Vitals:   11/24/17 1257  BP: 112/74  Pulse: 76  Temp: 98.6 F (37 C)  TempSrc: Oral  SpO2: 96%  Weight: 235 lb (106.6 kg)  Height: 6\' 5"  (1.956 m)    Body mass index is 27.87 kg/m.  Physical Exam  Constitutional: He appears well-developed and well-nourished.  Cardiovascular: Normal rate, regular rhythm and normal heart sounds.  Pulmonary/Chest: Effort normal and breath sounds normal.  Abdominal: Soft. Bowel sounds are normal.  Skin:  He has an open and draining area in left groin that is about .5-.75cm in diameter. No surrounding edema or erythema. No purulent discharge, clear fluid. He has multiple other scars over pubic area.   Vitals reviewed.      Assessment/plan: 1. Hidradenitis suppurativa Hurly stage 1. Very mild disease. Discussed this in detail and lack of understanding of pathogenesis. He doesn't smoke and is not very overweight. Likely due to family history. Did encourage him to stop shaving as this may contribute to disease. Will start him on topical clindamycin and see how he does. Recommended loose fitting clothing, etc.. And gave handout on information. precautions given for infection around area and to return if fever/chills, redness, swelling or change in appearance.       Return if symptoms worsen or fail to improve.   Orland MustardAllison Wolfe, MD [place] [facility]   11/24/2017

## 2017-11-25 ENCOUNTER — Ambulatory Visit: Payer: BLUE CROSS/BLUE SHIELD | Admitting: Family Medicine

## 2017-12-02 DIAGNOSIS — F4323 Adjustment disorder with mixed anxiety and depressed mood: Secondary | ICD-10-CM | POA: Diagnosis not present

## 2017-12-07 ENCOUNTER — Ambulatory Visit: Payer: BLUE CROSS/BLUE SHIELD | Admitting: Sports Medicine

## 2017-12-07 ENCOUNTER — Encounter: Payer: Self-pay | Admitting: Sports Medicine

## 2017-12-07 VITALS — BP 120/86 | HR 88 | Ht 77.0 in | Wt 237.0 lb

## 2017-12-07 DIAGNOSIS — M25561 Pain in right knee: Secondary | ICD-10-CM | POA: Diagnosis not present

## 2017-12-07 DIAGNOSIS — M25551 Pain in right hip: Secondary | ICD-10-CM

## 2017-12-07 DIAGNOSIS — M25562 Pain in left knee: Secondary | ICD-10-CM

## 2017-12-07 DIAGNOSIS — R29898 Other symptoms and signs involving the musculoskeletal system: Secondary | ICD-10-CM

## 2017-12-07 DIAGNOSIS — M24559 Contracture, unspecified hip: Secondary | ICD-10-CM | POA: Diagnosis not present

## 2017-12-07 DIAGNOSIS — G8929 Other chronic pain: Secondary | ICD-10-CM

## 2017-12-07 DIAGNOSIS — M25552 Pain in left hip: Secondary | ICD-10-CM

## 2017-12-07 NOTE — Progress Notes (Signed)
Veverly FellsMichael D. Delorise [name], DO  [place] [facility] [phone]  Julian Colonelri H Schoch - 22 y.o. male MRN [id number]  Date of birth: [birth year]  Visit Date: 12/07/2017  PCP: Helane RimaWallace, Erica, DO   Referred by: Helane RimaWallace, Erica, DO  Scribe(s) for today's visit: Christoper FabianMolly Brigit Doke, LAT, ATC  SUBJECTIVE:  Julian Paul is here for Follow-up (B hip and knee) .    His B hip and knee pain (R>L)symptoms INITIALLY: Began years ago w/ no known MOI.  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 Described as mild-severe depending on activity level that can be sharp at times, nonradiating Worsened with loaded deep knee flexion and transitioning from floor to stand; squat Improved with ice and elevation Additional associated symptoms include: intermittent swelling and popping/cracking in knees    At this time symptoms are worsening compared to onset w/ increased pain in knees and hips He has been using ice and elevation but has not used any medication due to digestive sensitivity.  Had B knee and hip XR on 10/07/17  12/07/17: Compared to the last office visit on 10/26/17, his previously described B knee pain symptoms are improving.  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  He feels like he is approximately 30% improved. Current symptoms are moderate & are nonradiating He has been doing his HEP 3x/week but hasn't looked at the [employer] videos.   REVIEW OF SYSTEMS: Denies night time disturbances. Denies fevers, chills, or night sweats. Denies unexplained weight loss. Denies personal history of cancer. Denies changes in bowel or bladder habits. Denies recent unreported falls. Denies new or worsening dyspnea or wheezing. Reports headaches or dizziness.   Yes to headaches. Denies numbness, tingling or weakness  In the extremities.  Denies dizziness or presyncopal episodes Denies lower extremity edema    HISTORY & PERTINENT PRIOR DATA:  Prior  History reviewed and updated per electronic medical record.  Significant/pertinent history, findings, studies include:  reports that he has never smoked. He has never used smokeless tobacco. No results for input(s): HGBA1C, LABURIC, CREATINE in the last 8760 hours. No specialty comments available. No problems updated.  OBJECTIVE:  VS:  HT:6\' 5"  (195.6 cm)   WT:237 lb (107.5 kg)  BMI:28.1    BP:120/86  HR:88bpm  TEMP: ( )  RESP:94 %   PHYSICAL EXAM: Constitutional: WDWN, Non-toxic appearing. Psychiatric: Alert & appropriately interactive.  Not depressed or anxious appearing. Respiratory: No increased work of breathing.  Trachea Midline Eyes: Pupils are equal.  EOM intact without nystagmus.  No scleral icterus  Vascular Exam: warm to touch no edema  upper and lower extremity neuro exam: unremarkable normal strength normal sensation normal reflexes  MSK Exam: Persistently tight hip flexors.  Improved hip abductor strength however.  Overall significant improvements in his lumbar spine alignment.  Bilateral VMOs are still atrophic but improved muscle tone.  He has tight vastus lateralis bilaterally.   ASSESSMENT & PLAN:   1. Pain of both hip joints   2. Chronic pain of both knees   3. Hip flexor tightness, unspecified laterality   4. Weakness of both hips     PLAN: Continue with home therapeutic exercises.  Slight modification discussed for side-lying hip abductor's with his back up against the wall.  Otherwise continue with daily TherEx and kneepads at work.  Follow-up: Return if symptoms worsen or fail to improve, for as needed for ongoing issues.  Please see additional documentation for Objective, Assessment and Plan sections. Pertinent additional documentation may be included in corresponding procedure notes, imaging studies, problem based documentation and patient instructions. Please see these sections of the encounter for additional information regarding this  visit.  CMA/ATC served as Neurosurgeon during this visit. History, Physical, and Plan performed by medical provider. Documentation and orders reviewed and attested to.      [name], DO    [place] [facility] Physician

## 2018-01-15 DIAGNOSIS — R197 Diarrhea, unspecified: Secondary | ICD-10-CM | POA: Diagnosis not present

## 2018-01-15 DIAGNOSIS — R112 Nausea with vomiting, unspecified: Secondary | ICD-10-CM | POA: Diagnosis not present

## 2018-01-17 ENCOUNTER — Encounter: Payer: Self-pay | Admitting: Family Medicine

## 2018-01-17 ENCOUNTER — Ambulatory Visit: Payer: BLUE CROSS/BLUE SHIELD | Admitting: Family Medicine

## 2018-01-17 VITALS — BP 110/66 | HR 80 | Temp 98.3°F | Ht 77.0 in | Wt 241.2 lb

## 2018-01-17 DIAGNOSIS — F4322 Adjustment disorder with anxiety: Secondary | ICD-10-CM

## 2018-01-17 DIAGNOSIS — K2900 Acute gastritis without bleeding: Secondary | ICD-10-CM | POA: Diagnosis not present

## 2018-01-17 DIAGNOSIS — R197 Diarrhea, unspecified: Secondary | ICD-10-CM

## 2018-01-17 NOTE — Progress Notes (Signed)
Julian Paul is a 22 y.o. male is here for follow up.  History of Present Illness:   Britt Bottom CMA acting as scribe for Dr. Earlene Plater.  HPI: Patient comes in today for a follow up from the ED. He was seen on Saturday for nausea and vomiting. Patient thinks that he had some food poisoning. Patient states that he is not having any more vomiting or nausea, but is still having some diarrhea. He said he goes about 2 or 3 times a day. Sometimes the stool is watery and sometimes it has some form to it.   Depression: Patient scored an 8 on his PHQ 9. [facility]   Health Maintenance Due  Topic Date Due  . INFLUENZA VACCINE  01/12/2018   Depression screen [facility] 2/9 01/17/2018 10/07/2017  Decreased Interest 2 0  Down, Depressed, Hopeless 2 0  PHQ - 2 Score 4 0  Altered sleeping 0 1  Tired, decreased energy 1 0  Change in appetite 0 0  Feeling bad or failure about yourself  2 0  Trouble concentrating 1 0  Moving slowly or fidgety/restless 0 3  Suicidal thoughts 0 0  PHQ-9 Score 8 4  Difficult doing work/chores Somewhat difficult Somewhat difficult   PMHx, SurgHx, SocialHx, FamHx, Medications, and Allergies were reviewed in the Visit Navigator and updated as appropriate.   Patient Active Problem List   Diagnosis Date Noted  . Atopic eczema 12/29/2016  . Adjustment disorder 10/27/2016  . Acne 06/26/2010   Social History   Tobacco Use  . Smoking status: Never Smoker  . Smokeless tobacco: Never Used  Substance Use Topics  . Alcohol use: Not on file  . Drug use: Not on file   Current Medications and Allergies:   .  acetaminophen (TYLENOL) 500 MG tablet, Take 1,000 mg by mouth 3 (three) times daily as needed for fever., Disp: , Rfl:  .  clindamycin (CLINDAGEL) 1 % gel, Apply topically 2 (two) times daily., Disp: 30 g, Rfl: 5 .  hydrOXYzine (ATARAX/VISTARIL) 25 MG tablet, Take 1 tablet (25 mg total) by mouth every 6 (six) hours. (Patient taking  differently: Take 50 mg by mouth every 6 (six) hours. ), Disp: 12 tablet, Rfl: 0 .  Multiple Vitamin (MULTIVITAMIN) tablet, Take 1 tablet by mouth daily., Disp: , Rfl:  .  sertraline (ZOLOFT) 100 MG tablet, Take 100 mg by mouth daily., Disp: , Rfl:  .  triamcinolone cream (KENALOG) 0.1 %, Apply 1 application topically once a week., Disp: , Rfl:   No Known Allergies   Review of Systems   Pertinent items are noted in the HPI. Otherwise, ROS is negative.  Vitals:   Vitals:   01/17/18 1053  BP: 110/66  Pulse: 80  Temp: 98.3 F (36.8 C)  TempSrc: Oral  SpO2: 96%  Weight: 241 lb 3.2 oz (109.4 kg)  Height: 6\' 5"  (1.956 m)     Body mass index is 28.6 kg/m.  Physical Exam:   Physical Exam  Constitutional: He is oriented to person, place, and time. He appears well-developed and well-nourished. No distress.  HENT:  Head: Normocephalic and atraumatic.  Right Ear: External ear normal.  Left Ear: External ear normal.  Nose: Nose normal.  Mouth/Throat: Oropharynx is clear and moist.  Eyes: Pupils are equal, round, and reactive to light. Conjunctivae and EOM are normal.  Neck: Normal range of motion. Neck supple.  Cardiovascular: Normal rate, regular rhythm, normal heart sounds and  intact distal pulses.  Pulmonary/Chest: Effort normal and breath sounds normal.  Abdominal: Soft. Bowel sounds are normal.  Musculoskeletal: Normal range of motion.  Neurological: He is alert and oriented to person, place, and time.  Skin: Skin is warm and dry.  Psychiatric: He has a normal mood and affect. His behavior is normal. Judgment and thought content normal.  Nursing note and vitals reviewed.  Assessment and Plan:   Julian Paul was seen today for follow-up.  Diagnoses and all orders for this visit:  Diarrhea, unspecified type -     Ova and parasite examination; Future -     Stool culture; Future -     Cancel: C. difficile GDH and Toxin A/B -     C. difficile GDH and Toxin A/B; Future -     C.  difficile GDH and Toxin A/B -     Ova and parasite examination -     Stool culture   . Reviewed expectations re: course of current medical issues. . Discussed self-management of symptoms. . Outlined signs and symptoms indicating need for more acute intervention. . Patient verbalized understanding and all questions were answered. Marland Kitchen. Health Maintenance issues including appropriate healthy diet, exercise, and smoking avoidance were discussed with patient. . See orders for this visit as documented in the electronic medical record. . Patient received an After Visit Summary.  CMA served as Neurosurgeonscribe during this visit. History, Physical, and Plan performed by medical provider. The above documentation has been reviewed and is accurate and complete. Helane RimaErica Karsyn Rochin, D.O.  Helane RimaErica Cicily Bonano, DO [place] 01/18/2018

## 2018-01-18 ENCOUNTER — Encounter: Payer: Self-pay | Admitting: Family Medicine

## 2018-01-20 ENCOUNTER — Telehealth: Payer: Self-pay | Admitting: Family Medicine

## 2018-01-20 NOTE — Telephone Encounter (Signed)
Copied from CRM [phone]#[id number]. Topic: Quick Communication - Lab Results >> Jan 20, 2018  2:14 PM Donnamarie Poaghompson, Joellen Y, CMA wrote: Called patient to inform them of 01/19/2018 lab results. When patient returns call, triage nurse may disclose results.

## 2018-01-20 NOTE — Telephone Encounter (Signed)
Charted in result notes. 

## 2018-01-21 LAB — STOOL CULTURE
MICRO NUMBER:: 90928760
MICRO NUMBER:: 90928762
MICRO NUMBER:: 90928764
SHIGA RESULT:: NOT DETECTED
SPECIMEN QUALITY:: ADEQUATE
SPECIMEN QUALITY:: ADEQUATE
SPECIMEN QUALITY:: ADEQUATE

## 2018-01-21 LAB — C. DIFFICILE GDH AND TOXIN A/B
GDH ANTIGEN: NOT DETECTED
MICRO NUMBER:: 90928761
SPECIMEN QUALITY:: ADEQUATE
TOXIN A AND B: NOT DETECTED

## 2018-01-21 LAB — OVA AND PARASITE EXAMINATION
CONCENTRATE RESULT:: NONE SEEN
MICRO NUMBER:: 90928763
SPECIMEN QUALITY:: ADEQUATE
TRICHROME RESULT:: NONE SEEN

## 2018-01-26 DIAGNOSIS — F3340 Major depressive disorder, recurrent, in remission, unspecified: Secondary | ICD-10-CM | POA: Diagnosis not present

## 2018-01-30 DIAGNOSIS — F3340 Major depressive disorder, recurrent, in remission, unspecified: Secondary | ICD-10-CM | POA: Diagnosis not present

## 2018-02-02 DIAGNOSIS — L219 Seborrheic dermatitis, unspecified: Secondary | ICD-10-CM | POA: Diagnosis not present

## 2018-02-02 DIAGNOSIS — L309 Dermatitis, unspecified: Secondary | ICD-10-CM | POA: Diagnosis not present

## 2018-02-02 DIAGNOSIS — L308 Other specified dermatitis: Secondary | ICD-10-CM | POA: Diagnosis not present

## 2018-02-12 ENCOUNTER — Other Ambulatory Visit: Payer: Self-pay

## 2018-02-12 ENCOUNTER — Emergency Department (HOSPITAL_COMMUNITY)
Admission: EM | Admit: 2018-02-12 | Discharge: 2018-02-12 | Disposition: A | Discharge status: Home / Self Care | Payer: BLUE CROSS/BLUE SHIELD | Attending: Emergency Medicine | Admitting: Emergency Medicine

## 2018-02-12 ENCOUNTER — Emergency Department (HOSPITAL_COMMUNITY): Payer: BLUE CROSS/BLUE SHIELD

## 2018-02-12 ENCOUNTER — Encounter (HOSPITAL_COMMUNITY): Payer: Self-pay | Admitting: Emergency Medicine

## 2018-02-12 DIAGNOSIS — Z79899 Other long term (current) drug therapy: Secondary | ICD-10-CM | POA: Insufficient documentation

## 2018-02-12 DIAGNOSIS — R0789 Other chest pain: Secondary | ICD-10-CM | POA: Diagnosis not present

## 2018-02-12 DIAGNOSIS — R079 Chest pain, unspecified: Secondary | ICD-10-CM | POA: Diagnosis not present

## 2018-02-12 HISTORY — DX: Major depressive disorder, single episode, unspecified: F32.9

## 2018-02-12 HISTORY — DX: Depression, unspecified: F32.A

## 2018-02-12 HISTORY — DX: Adjustment disorder, unspecified: F43.20

## 2018-02-12 HISTORY — DX: Anxiety disorder, unspecified: F41.9

## 2018-02-12 LAB — BASIC METABOLIC PANEL
ANION GAP: 10 (ref 5–15)
BUN: 15 mg/dL (ref 6–20)
CALCIUM: 9.7 mg/dL (ref 8.9–10.3)
CHLORIDE: 103 mmol/L (ref 98–111)
CO2: 26 mmol/L (ref 22–32)
CREATININE: 1.1 mg/dL (ref 0.61–1.24)
GFR calc non Af Amer: >60 mL/min (ref >60)
Glucose, Bld: 154 mg/dL — ABNORMAL HIGH (ref 70–99)
Potassium: 3.9 mmol/L (ref 3.5–5.1)
SODIUM: 139 mmol/L (ref 135–145)

## 2018-02-12 LAB — CBC
HCT: 46.3 % (ref 39.0–52.0)
HEMOGLOBIN: 14.8 g/dL (ref 13.0–17.0)
MCH: 27.7 pg (ref 26.0–34.0)
MCHC: 32 g/dL (ref 30.0–36.0)
MCV: 86.5 fL (ref 78.0–100.0)
PLATELETS: 215 10*3/uL (ref 150–400)
RBC: 5.35 MIL/uL (ref 4.22–5.81)
RDW: 13.2 % (ref 11.5–15.5)
WBC: 6.6 10*3/uL (ref 4.0–10.5)

## 2018-02-12 LAB — I-STAT TROPONIN, ED: Troponin i, poc: 0.01 ng/mL (ref 0.00–0.08)

## 2018-02-12 NOTE — ED Provider Notes (Signed)
[facility] EMERGENCY DEPARTMENT Provider Note   CSN: [id number] Arrival date & time: 02/12/18  0030     History   Chief Complaint Chief Complaint  Patient presents with  . Chest Pain    HPI Julian Paul is a 22 y.o. male.  Patient is a 22 year old male with history of anxiety, eczema.  He presents for evaluation of chest tightness that began this evening.  He states that he was at home when he developed tightness in his chest and throat.  This came on and lasted for approximately 20 minutes, then seemed to improve.  According to his male companion at bedside, she states that he was having difficulty articulating.  He felt short of breath during this episode, however denies nausea or radiation to the arm or jaw.  He has no cardiac risk factors and no prior cardiac history.     Past Medical History:  Diagnosis Date  . Adjustment disorder   . Anxiety   . Depression     Patient Active Problem List   Diagnosis Date Noted  . Atopic eczema 12/29/2016  . Adjustment disorder 10/27/2016  . Acne 06/26/2010    Past Surgical History:  Procedure Laterality Date  . undescended testes          Home Medications    Prior to Admission medications   Medication Sig Start Date End Date Taking? Authorizing Provider  acetaminophen (TYLENOL) 500 MG tablet Take 1,000 mg by mouth 3 (three) times daily as needed for fever.    [provider]  clindamycin (CLINDAGEL) 1 % gel Apply topically 2 (two) times daily. 11/24/17   Orland Mustard, MD  hydrOXYzine (ATARAX/VISTARIL) 25 MG tablet Take 1 tablet (25 mg total) by mouth every 6 (six) hours. Patient taking differently: Take 50 mg by mouth every 6 (six) hours.  05/18/17   Nira Conn, MD  Multiple Vitamin (MULTIVITAMIN) tablet Take 1 tablet by mouth daily.    [provider]  sertraline (ZOLOFT) 100 MG tablet Take 100 mg by mouth daily.    [provider]  triamcinolone cream (KENALOG)  0.1 % Apply 1 application topically once a week.    [provider]    Family History Family History  Problem Relation Age of Onset  . Cancer - Other Father        Gastric    Social History Social History   Tobacco Use  . Smoking status: Never Smoker  . Smokeless tobacco: Never Used  Substance Use Topics  . Alcohol use: Yes  . Drug use: Not Currently     Allergies   Patient has no known allergies.   Review of Systems Review of Systems  All other systems reviewed and are negative.    Physical Exam Updated Vital Signs BP 133/89 (BP Location: Right Arm) Comment: Simultaneous filing. User may not have seen previous data. Comment (BP Location): Simultaneous filing. User may not have seen previous data.  Pulse 77 Comment: Simultaneous filing. User may not have seen previous data.  Temp 98.2 F (36.8 C) (Oral) Comment: Simultaneous filing. User may not have seen previous data. Comment (Src): Simultaneous filing. User may not have seen previous data.  Resp 16 Comment: Simultaneous filing. User may not have seen previous data.  Ht 6\' 5"  (1.956 m)   Wt 111.6 kg   SpO2 100% Comment: Simultaneous filing. User may not have seen previous data.  BMI 29.17 kg/m   Physical Exam  Constitutional: He is oriented  to person, place, and time. He appears well-developed and well-nourished. No distress.  HENT:  Head: Normocephalic and atraumatic.  Mouth/Throat: Oropharynx is clear and moist.  Neck: Normal range of motion. Neck supple.  Cardiovascular: Normal rate and regular rhythm. Exam reveals no friction rub.  No murmur heard. Pulmonary/Chest: Effort normal and breath sounds normal. No respiratory distress. He has no wheezes. He has no rales.  Abdominal: Soft. Bowel sounds are normal. He exhibits no distension. There is no tenderness.  Musculoskeletal: Normal range of motion. He exhibits no edema.  Neurological: He is alert and oriented to person, place, and time.  Coordination normal.  Skin: Skin is warm and dry. He is not diaphoretic.  Nursing note and vitals reviewed.    ED Treatments / Results  Labs (all labs ordered are listed, but only abnormal results are displayed) Labs Reviewed  BASIC METABOLIC PANEL - Abnormal; Notable for the following components:      Result Value   Glucose, Bld 154 (*)    All other components within normal limits  CBC  I-STAT TROPONIN, ED    EKG EKG Interpretation  Date/Time:  Sunday February 12 2018 00:33:36 EDT Ventricular Rate:  75 PR Interval:  142 QRS Duration: 92 QT Interval:  354 QTC Calculation: 395 R Axis:   54 Text Interpretation:  Normal sinus rhythm Nonspecific ST and T wave abnormality Abnormal ECG Confirmed by Geoffery Lyons ([id number]) on 02/12/2018 2:07:49 AM   Radiology Dg Chest 2 View  Result Date: 02/12/2018 CLINICAL DATA:  22 year old male with chest pain and shortness of breath. EXAM: CHEST - 2 VIEW COMPARISON:  None. FINDINGS: The heart size and mediastinal contours are within normal limits. Both lungs are clear. The visualized skeletal structures are unremarkable. IMPRESSION: No active cardiopulmonary disease. Electronically Signed   By: Elgie Collard M.D.   On: 02/12/2018 01:17    Procedures Procedures (including critical care time)  Medications Ordered in ED Medications - No data to display   Initial Impression / Assessment and Plan / ED Course  I have reviewed the triage vital signs and the nursing notes.  Pertinent labs & imaging results that were available during my care of the patient were reviewed by me and considered in my medical decision making (see chart for details).  I am uncertain as to the etiology of this episode, however nothing appears emergent.  His EKG is unremarkable and troponin is negative.  He is now feeling back to baseline.  I suspect this may have been some sort of anxiety or panic attack.  The patient was offered a second troponin, however he states he  feels fine and wants to go home.  He will be discharged with follow-up as needed.  Final Clinical Impressions(s) / ED Diagnoses   Final diagnoses:  None    ED Discharge Orders    None       Geoffery Lyons, MD 02/12/18 Earle Gell

## 2018-02-12 NOTE — Discharge Instructions (Addendum)
Return to the emergency department if your symptoms significantly worsen or change. 

## 2018-02-12 NOTE — ED Triage Notes (Signed)
C/o nausea that started at 11:15pm followed by L sided chest tightness, L jaw, and throat pain.  Also reports SOB.

## 2018-02-13 DIAGNOSIS — F3340 Major depressive disorder, recurrent, in remission, unspecified: Secondary | ICD-10-CM | POA: Diagnosis not present

## 2018-02-14 DIAGNOSIS — L219 Seborrheic dermatitis, unspecified: Secondary | ICD-10-CM | POA: Insufficient documentation

## 2018-02-14 DIAGNOSIS — L309 Dermatitis, unspecified: Secondary | ICD-10-CM | POA: Insufficient documentation

## 2018-02-16 DIAGNOSIS — Z79899 Other long term (current) drug therapy: Secondary | ICD-10-CM | POA: Diagnosis not present

## 2018-02-16 DIAGNOSIS — M329 Systemic lupus erythematosus, unspecified: Secondary | ICD-10-CM | POA: Diagnosis not present

## 2018-02-24 DIAGNOSIS — F3340 Major depressive disorder, recurrent, in remission, unspecified: Secondary | ICD-10-CM | POA: Diagnosis not present

## 2018-03-01 DIAGNOSIS — F3340 Major depressive disorder, recurrent, in remission, unspecified: Secondary | ICD-10-CM | POA: Diagnosis not present

## 2018-03-06 DIAGNOSIS — F3340 Major depressive disorder, recurrent, in remission, unspecified: Secondary | ICD-10-CM | POA: Diagnosis not present

## 2018-03-13 ENCOUNTER — Encounter: Payer: Self-pay | Admitting: Physician Assistant

## 2018-03-13 ENCOUNTER — Ambulatory Visit (INDEPENDENT_AMBULATORY_CARE_PROVIDER_SITE_OTHER): Payer: BLUE CROSS/BLUE SHIELD

## 2018-03-13 ENCOUNTER — Other Ambulatory Visit (HOSPITAL_COMMUNITY)
Admission: RE | Admit: 2018-03-13 | Discharge: 2018-03-13 | Disposition: A | Discharge status: Home / Self Care | Payer: BLUE CROSS/BLUE SHIELD | Source: Ambulatory Visit | Attending: Physician Assistant | Admitting: Physician Assistant

## 2018-03-13 ENCOUNTER — Ambulatory Visit: Payer: BLUE CROSS/BLUE SHIELD | Admitting: Physician Assistant

## 2018-03-13 VITALS — BP 126/80 | HR 79 | Temp 97.7°F | Ht 77.0 in | Wt 260.5 lb

## 2018-03-13 DIAGNOSIS — Z113 Encounter for screening for infections with a predominantly sexual mode of transmission: Secondary | ICD-10-CM | POA: Diagnosis not present

## 2018-03-13 DIAGNOSIS — R81 Glycosuria: Secondary | ICD-10-CM | POA: Diagnosis not present

## 2018-03-13 DIAGNOSIS — R109 Unspecified abdominal pain: Secondary | ICD-10-CM

## 2018-03-13 DIAGNOSIS — F3340 Major depressive disorder, recurrent, in remission, unspecified: Secondary | ICD-10-CM | POA: Diagnosis not present

## 2018-03-13 DIAGNOSIS — Z23 Encounter for immunization: Secondary | ICD-10-CM

## 2018-03-13 LAB — POCT URINALYSIS DIPSTICK
Bilirubin, UA: NEGATIVE
Glucose, UA: POSITIVE — AB
Ketones, UA: NEGATIVE
LEUKOCYTES UA: NEGATIVE
Nitrite, UA: NEGATIVE
PROTEIN UA: NEGATIVE
RBC UA: NEGATIVE
SPEC GRAV UA: 1.01 (ref 1.010–1.025)
Urobilinogen, UA: 0.2 E.U./dL
pH, UA: 8.5 — AB (ref 5.0–8.0)

## 2018-03-13 LAB — COMPREHENSIVE METABOLIC PANEL
ALBUMIN: 4 g/dL (ref 3.5–5.2)
ALT: 16 U/L (ref 0–53)
AST: 20 U/L (ref 0–37)
Alkaline Phosphatase: 72 U/L (ref 39–117)
BUN: 16 mg/dL (ref 6–23)
CHLORIDE: 101 meq/L (ref 96–112)
CO2: 34 mEq/L — ABNORMAL HIGH (ref 19–32)
Calcium: 9.3 mg/dL (ref 8.4–10.5)
Creatinine, Ser: 1.09 mg/dL (ref 0.40–1.50)
GFR: 108.68 mL/min (ref >60.00)
GLUCOSE: 91 mg/dL (ref 70–99)
Potassium: 4 mEq/L (ref 3.5–5.1)
SODIUM: 142 meq/L (ref 135–145)
Total Bilirubin: 0.3 mg/dL (ref 0.2–1.2)
Total Protein: 7.1 g/dL (ref 6.0–8.3)

## 2018-03-13 LAB — CBC WITH DIFFERENTIAL/PLATELET
BASOS PCT: 0.4 % (ref 0.0–3.0)
Basophils Absolute: 0 10*3/uL (ref 0.0–0.1)
EOS ABS: 0.1 10*3/uL (ref 0.0–0.7)
Eosinophils Relative: 0.9 % (ref 0.0–5.0)
HCT: 41.3 % (ref 39.0–52.0)
HEMOGLOBIN: 13.9 g/dL (ref 13.0–17.0)
Lymphocytes Relative: 23.7 % (ref 12.0–46.0)
Lymphs Abs: 1.4 10*3/uL (ref 0.7–4.0)
MCHC: 33.6 g/dL (ref 30.0–36.0)
MCV: 83.8 fl (ref 78.0–100.0)
MONO ABS: 0.7 10*3/uL (ref 0.1–1.0)
Monocytes Relative: 11.2 % (ref 3.0–12.0)
Neutro Abs: 3.8 10*3/uL (ref 1.4–7.7)
Neutrophils Relative %: 63.8 % (ref 43.0–77.0)
Platelets: 218 10*3/uL (ref 150.0–400.0)
RBC: 4.93 Mil/uL (ref 4.22–5.81)
RDW: 13.8 % (ref 11.5–15.5)
WBC: 6 10*3/uL (ref 4.0–10.5)

## 2018-03-13 LAB — HEMOGLOBIN A1C: Hgb A1c MFr Bld: 5.9 % (ref 4.6–6.5)

## 2018-03-13 LAB — LIPASE: LIPASE: 18 U/L (ref 11.0–59.0)

## 2018-03-13 LAB — POCT GLUCOSE (DEVICE FOR HOME USE): POC Glucose: 109 mg/dl — AB (ref 70–99)

## 2018-03-13 MED ORDER — FAMOTIDINE 10 MG PO TABS: 10.0000 mg | ORAL_TABLET | Freq: Every day | ORAL | 0 refills | Status: AC

## 2018-03-13 NOTE — Patient Instructions (Addendum)
Work on a bowel regimen.  Follow-up if symptoms worsen or persist.   Abdominal Pain, Adult Many things can cause belly (abdominal) pain. Most times, belly pain is not dangerous. Many cases of belly pain can be watched and treated at home. Sometimes belly pain is serious, though. Your doctor will try to find the cause of your belly pain. Follow these instructions at home:  Take over-the-counter and prescription medicines only as told by your doctor. Do not take medicines that help you poop (laxatives) unless told to by your doctor.  Drink enough fluid to keep your pee (urine) clear or pale yellow.  Watch your belly pain for any changes.  Keep all follow-up visits as told by your doctor. This is important. Contact a doctor if:  Your belly pain changes or gets worse.  You are not hungry, or you lose weight without trying.  You are having trouble pooping (constipated) or have watery poop (diarrhea) for more than 2-3 days.  You have pain when you pee or poop.  Your belly pain wakes you up at night.  Your pain gets worse with meals, after eating, or with certain foods.  You are throwing up and cannot keep anything down.  You have a fever. Get help right away if:  Your pain does not go away as soon as your doctor says it should.  You cannot stop throwing up.  Your pain is only in areas of your belly, such as the right side or the left lower part of the belly.  You have bloody or black poop, or poop that looks like tar.  You have very bad pain, cramping, or bloating in your belly.  You have signs of not having enough fluid or water in your body (dehydration), such as: ? Dark pee, very little pee, or no pee. ? Cracked lips. ? Dry mouth. ? Sunken eyes. ? Sleepiness. ? Weakness. This information is not intended to replace advice given to you by your health care provider. Make sure you discuss any questions you have with your health care provider. Document Released:  11/17/2007 Document Revised: 12/19/2015 Document Reviewed: 11/12/2015 Elsevier Interactive Patient Education  2018 [employer].

## 2018-03-13 NOTE — Progress Notes (Signed)
Julian Paul is a 22 y.o. male here for a new problem.  I acted as a Neurosurgeon for [employer], PA-C  Corky Mull, LPN  History of Present Illness:   Chief Complaint  Patient presents with  . Left Flank Pain    Flank Pain  This is a new problem. The current episode started today (Pt woke up this moring with left flank pain). The problem occurs constantly. The problem has been gradually worsening since onset. Pain location: Left Flank area. The quality of the pain is described as aching and stabbing. Radiates to: comes around to LLQ off and on  The pain is at a severity of 5/10. The pain is moderate. The pain is the same all the time. The symptoms are aggravated by sitting. Associated symptoms include abdominal pain (LLQ at times). Pertinent negatives include no chest pain, dysuria, fever or headaches. (Diarrhea this morning x 3 ) He has tried nothing for the symptoms.   Dr. Lowell Guitar at [facility] as of April 2019 --> was sent there for elevated sugar in urine work-up was negative -- we are requesting records today. Has never been diagnosed with diabetes.   When he was 11 or 12, he was drinking a lot of sugar-sweetened beverages. Had burning with urination for "months" while he was "chugging" sweet tea. Has one partner, doesn't use a condom, but monogamous x 3 years with one male partner. No hx STI. Does have random pain in his L and R testicle but this has been going on "for decades."  Type 7 stools since this pain. No vomiting. No nausea. Was able to eat a normal meal before coming to see [place] -- had [employer] with sweetened tea. States that he had normal bowel movements recently, has dealt with this in the past.   Past Medical History:  Diagnosis Date  . Adjustment disorder   . Anxiety   . Depression      Social History   Socioeconomic History  . Marital status: Significant Other    Spouse name: Not on file  . Number of children: Not on file  . Years of  education: Not on file  . Highest education level: Not on file  Occupational History  . Not on file  Social Needs  . Financial resource strain: Not on file  . Food insecurity:    Worry: Not on file    Inability: Not on file  . Transportation needs:    Medical: Not on file    Non-medical: Not on file  Tobacco Use  . Smoking status: Never Smoker  . Smokeless tobacco: Never Used  Substance and Sexual Activity  . Alcohol use: Yes  . Drug use: Not Currently  . Sexual activity: Not on file  Lifestyle  . Physical activity:    Days per week: Not on file    Minutes per session: Not on file  . Stress: Not on file  Relationships  . Social connections:    Talks on phone: Not on file    Gets together: Not on file    Attends religious service: Not on file    Active member of club or organization: Not on file    Attends meetings of clubs or organizations: Not on file    Relationship status: Not on file  . Intimate partner violence:    Fear of current or ex partner: Not on file    Emotionally abused: Not on file    Physically abused: Not on file  Forced sexual activity: Not on file  Other Topics Concern  . Not on file  Social History Narrative  . Not on file    Past Surgical History:  Procedure Laterality Date  . undescended testes      Family History  Problem Relation Age of Onset  . Cancer - Other Father 15       Gastric  . Colon cancer Maternal Grandmother 30    No Known Allergies  Current Medications:   Current Outpatient Medications:  .  acetaminophen (TYLENOL) 500 MG tablet, Take 1,000 mg by mouth 3 (three) times daily as needed for fever., Disp: , Rfl:  .  clindamycin (CLINDAGEL) 1 % gel, Apply topically 2 (two) times daily., Disp: 30 g, Rfl: 5 .  hydrOXYzine (ATARAX/VISTARIL) 25 MG tablet, Take 1 tablet (25 mg total) by mouth every 6 (six) hours. (Patient taking differently: Take 50 mg by mouth every 6 (six) hours. ), Disp: 12 tablet, Rfl: 0 .  Multiple  Vitamin (MULTIVITAMIN) tablet, Take 1 tablet by mouth daily., Disp: , Rfl:  .  triamcinolone cream (KENALOG) 0.1 %, Apply 1 application topically once a week., Disp: , Rfl:  .  Vilazodone HCl (VIIBRYD) 40 MG TABS, Take 40 mg by mouth daily., Disp: , Rfl:  .  famotidine (PEPCID) 10 MG tablet, Take 1 tablet (10 mg total) by mouth daily., Disp: 30 tablet, Rfl: 0   Review of Systems:    Review of Systems  Constitutional: Negative for fever.  Cardiovascular: Negative for chest pain.  Gastrointestinal: Positive for abdominal pain (LLQ at times).  Genitourinary: Positive for flank pain. Negative for dysuria.  Neurological: Negative for headaches.    Vitals:   Vitals:   03/13/18 1256  BP: 126/80  Pulse: 79  Temp: 97.7 F (36.5 C)  TempSrc: Oral  SpO2: 97%  Weight: 260 lb 8 oz (118.2 kg)  Height: 6\' 5"  (1.956 m)     Body mass index is 30.89 kg/m.  Physical Exam:   Physical Exam  Constitutional: He appears well-developed. He is cooperative.  Non-toxic appearance. He does not have a sickly appearance. He does not appear ill. No distress.  Cardiovascular: Normal rate, regular rhythm, S1 normal, S2 normal, normal heart sounds and normal pulses.  No LE edema  Pulmonary/Chest: Effort normal and breath sounds normal.  Abdominal: Normal appearance. Bowel sounds are increased. There is no tenderness. There is no rigidity, no rebound, no guarding and no CVA tenderness.  Neurological: He is alert. GCS eye subscore is 4. GCS verbal subscore is 5. GCS motor subscore is 6.  Skin: Skin is warm, dry and intact.  Psychiatric: He has a normal mood and affect. His speech is normal and behavior is normal.  Nursing note and vitals reviewed.  Results for orders placed or performed in visit on 03/13/18  POCT urinalysis dipstick  Result Value Ref Range   Color, UA Yellow    Clarity, UA Clear    Glucose, UA Positive (A) Negative   Bilirubin, UA Negative    Ketones, UA Negative    Spec Grav, UA  1.010 1.010 - 1.025   Blood, UA Negative    pH, UA 8.5 (A) 5.0 - 8.0   Protein, UA Negative Negative   Urobilinogen, UA 0.2 0.2 or 1.0 E.U./dL   Nitrite, UA Negative    Leukocytes, UA Negative Negative   Appearance     Odor    POCT Glucose (Device for Home Use)  Result Value Ref Range  POC Glucose 109 (A) 70 - 99 mg/dl   Abd xray -- moderate gas and stool burden  Assessment and Plan:    Wilbern was seen today for left flank pain.  Diagnoses and all orders for this visit:  Left flank pain No red flags on exam. Abd xray with moderate gas and stool burden. Labs pending. Suspect constipation. Discussed bowel regimen starting with colace. Handout provided. Follow-up if symptoms worsen or persist despite treatment.  He is having some occasional GERD, will give prescription for Pepcid should he need it. -     POCT urinalysis dipstick -     CBC with Differential/Platelet -     Comprehensive metabolic panel -     Lipase -     Urine Culture -     DG Abd 2 Views; Future  Screening examination for STD (sexually transmitted disease) Agreeable to screening today. -     Urine cytology ancillary only  Glucosuria Urine with glucose. CBG is 109. We will check HgbA1c. He has a history of normal workup for this per his report from nephrologist. I am requesting records today.  -     Hemoglobin A1c -     POCT Glucose (Device for Home Use)  Need for prophylactic vaccination and inoculation against influenza -     Flu Vaccine QUAD 36+ mos IM  Other orders -     famotidine (PEPCID) 10 MG tablet; Take 1 tablet (10 mg total) by mouth daily.  . Reviewed expectations re: course of current medical issues. . Discussed self-management of symptoms. . Outlined signs and symptoms indicating need for more acute intervention. . Patient verbalized understanding and all questions were answered. . See orders for this visit as documented in the electronic medical record. . Patient received an After-Visit  Summary.  CMA or LPN served as scribe during this visit. History, Physical, and Plan performed by medical provider. The above documentation has been reviewed and is accurate and complete.  Jarold Motto, PA-C

## 2018-03-14 LAB — URINE CULTURE
MICRO NUMBER:: 91171293
SPECIMEN QUALITY:: ADEQUATE

## 2018-03-14 LAB — URINE CYTOLOGY ANCILLARY ONLY
Chlamydia: NEGATIVE
NEISSERIA GONORRHEA: NEGATIVE
TRICH (WINDOWPATH): NEGATIVE

## 2018-03-30 DIAGNOSIS — F3340 Major depressive disorder, recurrent, in remission, unspecified: Secondary | ICD-10-CM | POA: Diagnosis not present

## 2018-04-03 DIAGNOSIS — F3340 Major depressive disorder, recurrent, in remission, unspecified: Secondary | ICD-10-CM | POA: Diagnosis not present

## 2018-04-09 NOTE — Progress Notes (Signed)
Julian Paul is a 22 y.o. male is here for follow up.  History of Present Illness:   HPI: Since our last visit, he has established with Psychiatry. New medication regimen includes Viibryd and Abilify. Improving anxiety and sleep but causing decreased libido and anorgasmia. Failed most of his classes this quarter. Having a very difficult time concentrating. Thinking back, this has been an issue for him since he was a child - difficulty concentrating on tasks, fidgeting.     Adult ADHD Self Report Scale (most recent)    Adult ADHD Self-Report Scale (ASRS-v1.1) Symptom Checklist - 04/10/18 1021      Part A   1. How often do you have trouble wrapping up the final details of a project, once the challenging parts have been done?  (!) Sometimes  2. How often do you have difficulty getting things done in order when you have to do a task that requires organization?  (!) Sometimes    3. How often do you have problems remembering appointments or obligations?  Never  4. When you have a task that requires a lot of thought, how often do you avoid or delay getting started?  (!) Very Often    5. How often do you fidget or squirm with your hands or feet when you have to sit down for a long time?  Sometimes  6. How often do you feel overly active and compelled to do things, like you were driven by a motor?  Sometimes      Part B   7. How often do you make careless mistakes when you have to work on a boring or difficult project?  Sometimes  8. How often do you have difficulty keeping your attention when you are doing boring or repetitive work?  Sometimes    9. How often do you have difficulty concentrating on what people say to you, even when they are speaking to you directly?  (!) Sometimes  10. How often do you misplace or have difficulty finding things at home or at work?  (!) Often    11. How often are you distracted by activity or noise around you?  (!) Often  12. How often do you leave your seat in meetings  or other situations in which you are expected to remain seated?  Rarely    13. How often do you feel restless or fidgety?  Sometimes  14. How often do you have difficulty unwinding and relaxing when you have time to yourself?  (!) Very Often    15. How often do you find yourself talking too much when you are in social situations?  (!) Very Often  16. When you are in a conversation, how often do you find yourself finishing the sentences of the people you are talking to, before they can finish them themselves?  (!) Sometimes    17. How often do you have difficulty waiting your turn in situations when turn taking is required?  Rarely  18. How often do you interrupt others when they are busy?  (!) Sometimes      There are no preventive care reminders to display for this patient.   Depression screen [facility] 2/9 01/17/2018 10/07/2017  Decreased Interest 2 0  Down, Depressed, Hopeless 2 0  PHQ - 2 Score 4 0  Altered sleeping 0 1  Tired, decreased energy 1 0  Change in appetite 0 0  Feeling bad or failure about yourself  2 0  Trouble concentrating  1 0  Moving slowly or fidgety/restless 0 3  Suicidal thoughts 0 0  PHQ-9 Score 8 4  Difficult doing work/chores Somewhat difficult Somewhat difficult   PMHx, SurgHx, SocialHx, FamHx, Medications, and Allergies were reviewed in the Visit Navigator and updated as appropriate.   Patient Active Problem List   Diagnosis Date Noted  . GAD (generalized anxiety disorder) 04/12/2018  . Mood disorder (HCC) 04/12/2018  . Seborrheic dermatitis, unspecified 02/14/2018  . Atopic eczema 12/29/2016  . Acne 06/26/2010   Social History   Tobacco Use  . Smoking status: Never Smoker  . Smokeless tobacco: Never Used  Substance Use Topics  . Alcohol use: Yes  . Drug use: Not Currently   Current Medications and Allergies:   .  acetaminophen (TYLENOL) 500 MG tablet, Take 1,000 mg by mouth 3 (three) times daily as needed for fever., Disp: , Rfl:  .  clindamycin  (CLINDAGEL) 1 % gel, Apply topically 2 (two) times daily., Disp: 30 g, Rfl: 5 .  famotidine (PEPCID) 10 MG tablet, Take 1 tablet (10 mg total) by mouth daily., Disp: 30 tablet, Rfl: 0 .  hydrOXYzine (ATARAX/VISTARIL) 25 MG tablet, Take 1 tablet (25 mg total) by mouth every 6 (six) hours. (Patient taking differently: Take 50 mg by mouth every 6 (six) hours. ), Disp: 12 tablet, Rfl: 0 .  Multiple Vitamin (MULTIVITAMIN) tablet, Take 1 tablet by mouth daily., Disp: , Rfl:  .  triamcinolone cream (KENALOG) 0.1 %, Apply 1 application topically once a week., Disp: , Rfl:  .  Vilazodone HCl (VIIBRYD) 40 MG TABS, Take 40 mg by mouth daily., Disp: , Rfl:   No Known Allergies   Review of Systems   Pertinent items are noted in the HPI. Otherwise, ROS is negative.  Vitals:   Vitals:   04/10/18 0904  BP: 108/70  Pulse: 83  Temp: 98.2 F (36.8 C)  TempSrc: Oral  SpO2: 95%  Weight: 257 lb 3.2 oz (116.7 kg)  Height: 6\' 5"  (1.956 m)     Body mass index is 30.5 kg/m.  Physical Exam:   Physical Exam  Constitutional: He is oriented to person, place, and time. He appears well-developed and well-nourished. No distress.  HENT:  Head: Normocephalic and atraumatic.  Right Ear: External ear normal.  Left Ear: External ear normal.  Nose: Nose normal.  Mouth/Throat: Oropharynx is clear and moist.  Eyes: Pupils are equal, round, and reactive to light. Conjunctivae and EOM are normal.  Neck: Normal range of motion. Neck supple.  Cardiovascular: Normal rate, regular rhythm, normal heart sounds and intact distal pulses.  Pulmonary/Chest: Effort normal and breath sounds normal.  Abdominal: Soft. Bowel sounds are normal.  Musculoskeletal: Normal range of motion.  Neurological: He is alert and oriented to person, place, and time.  Skin: Skin is warm and dry.  Psychiatric: He has a normal mood and affect. His behavior is normal. Judgment and thought content normal.  Nursing note and vitals  reviewed.   Assessment and Plan:   GAD (generalized anxiety disorder) I suspect that the patient's main issues are actually anxiety and ADHD. We discussed this at length. ADHD questionnaire completed. I defer to Psychiatry here; asked him to discuss with them. My concern is that we need to treat his ADHD in order for his anxiety (which focuses around school and social situations), in order to decrease his anxiety. We may then be able to decrease his other medications. Abilify is helping mood and sleep, but we will need to  watch closely since his A1c is already elevated.  . Reviewed expectations re: course of current medical issues. . Discussed self-management of symptoms. . Outlined signs and symptoms indicating need for more acute intervention. . Patient verbalized understanding and all questions were answered. Marland Kitchen Health Maintenance issues including appropriate healthy diet, exercise, and smoking avoidance were discussed with patient. . See orders for this visit as documented in the electronic medical record. . Patient received an After Visit Summary.  Helane Rima, DO , [place] 04/12/2018  Records requested if needed. Time spent with the patient: 25 minutes, of which >50% was spent in obtaining information about his symptoms, reviewing his previous labs, evaluations, and treatments, counseling him about his condition (please see the discussed topics above), and developing a plan to further investigate it; he had a number of questions which I addressed.

## 2018-04-10 ENCOUNTER — Ambulatory Visit: Payer: BLUE CROSS/BLUE SHIELD | Admitting: Family Medicine

## 2018-04-10 ENCOUNTER — Encounter: Payer: Self-pay | Admitting: Family Medicine

## 2018-04-10 DIAGNOSIS — F411 Generalized anxiety disorder: Secondary | ICD-10-CM

## 2018-04-10 DIAGNOSIS — F39 Unspecified mood [affective] disorder: Secondary | ICD-10-CM

## 2018-04-10 DIAGNOSIS — F3340 Major depressive disorder, recurrent, in remission, unspecified: Secondary | ICD-10-CM | POA: Diagnosis not present

## 2018-04-12 ENCOUNTER — Encounter: Payer: Self-pay | Admitting: Family Medicine

## 2018-04-12 DIAGNOSIS — F411 Generalized anxiety disorder: Secondary | ICD-10-CM | POA: Insufficient documentation

## 2018-04-12 DIAGNOSIS — F39 Unspecified mood [affective] disorder: Secondary | ICD-10-CM | POA: Insufficient documentation

## 2018-04-12 NOTE — Assessment & Plan Note (Signed)
I suspect that the patient's main issues are actually anxiety and ADHD. We discussed this at length. ADHD questionnaire completed. I defer to Psychiatry here; asked him to discuss with them. My concern is that we need to treat his ADHD in order for his anxiety (which focuses around school and social situations), in order to decrease his anxiety. We may then be able to decrease his other medications. Abilify is helping mood and sleep, but we will need to watch closely since his A1c is already elevated.

## 2018-04-17 DIAGNOSIS — F3340 Major depressive disorder, recurrent, in remission, unspecified: Secondary | ICD-10-CM | POA: Diagnosis not present

## 2018-04-18 ENCOUNTER — Encounter: Payer: Self-pay | Admitting: Family Medicine

## 2018-04-24 DIAGNOSIS — F3340 Major depressive disorder, recurrent, in remission, unspecified: Secondary | ICD-10-CM | POA: Diagnosis not present

## 2018-05-15 DIAGNOSIS — F3340 Major depressive disorder, recurrent, in remission, unspecified: Secondary | ICD-10-CM | POA: Diagnosis not present

## 2018-05-16 DIAGNOSIS — L83 Acanthosis nigricans: Secondary | ICD-10-CM | POA: Insufficient documentation

## 2018-05-17 DIAGNOSIS — F3340 Major depressive disorder, recurrent, in remission, unspecified: Secondary | ICD-10-CM | POA: Diagnosis not present

## 2018-05-22 DIAGNOSIS — L853 Xerosis cutis: Secondary | ICD-10-CM | POA: Diagnosis not present

## 2018-05-22 DIAGNOSIS — F3340 Major depressive disorder, recurrent, in remission, unspecified: Secondary | ICD-10-CM | POA: Diagnosis not present

## 2018-05-22 DIAGNOSIS — L819 Disorder of pigmentation, unspecified: Secondary | ICD-10-CM | POA: Diagnosis not present

## 2018-05-22 DIAGNOSIS — L259 Unspecified contact dermatitis, unspecified cause: Secondary | ICD-10-CM | POA: Diagnosis not present

## 2018-05-22 DIAGNOSIS — L219 Seborrheic dermatitis, unspecified: Secondary | ICD-10-CM | POA: Diagnosis not present

## 2018-05-29 DIAGNOSIS — F3340 Major depressive disorder, recurrent, in remission, unspecified: Secondary | ICD-10-CM | POA: Diagnosis not present

## 2018-06-05 DIAGNOSIS — F3340 Major depressive disorder, recurrent, in remission, unspecified: Secondary | ICD-10-CM | POA: Diagnosis not present

## 2018-06-12 DIAGNOSIS — F3340 Major depressive disorder, recurrent, in remission, unspecified: Secondary | ICD-10-CM | POA: Diagnosis not present

## 2018-06-12 NOTE — Progress Notes (Signed)
Julian Paul is a 22 y.o. male is here for follow up.  History of Present Illness:   HPI: Followed by Psychiatry and seeing therapist weekly. Medication changes since last visit. See medication list. Passed classes. Feels that the Adderall has helped significantly. Forgetting the second dose often. Still with some anxiety. Takes the Hydroxyzine at night only due to sedation. Not exercising. Girlfriend is very supportive.   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 Improving with Doxy x a few months.  No longer on Abilify. Will recheck A1c today. Weight up 10 pounds.   There are no preventive care reminders to display for this patient. [facility] 01/17/2018 10/07/2017  Decreased Interest 2 0  Down, Depressed, Hopeless 2 0  PHQ - 2 Score 4 0  Altered sleeping 0 1  Tired, decreased energy 1 0  Change in appetite 0 0  Feeling bad or failure about yourself  2 0  Trouble concentrating 1 0  Moving slowly or fidgety/restless 0 3  Suicidal thoughts 0 0  PHQ-9 Score 8 4  Difficult doing work/chores Somewhat difficult Somewhat difficult   PMHx, SurgHx, SocialHx, FamHx, Medications, and Allergies were reviewed in the Visit Navigator and updated as appropriate.   Patient Active Problem List   Diagnosis Date Noted  . GAD (generalized anxiety disorder) 04/12/2018  . Mood disorder (HCC) 04/12/2018  . Seborrheic dermatitis, unspecified 02/14/2018  . Atopic eczema 12/29/2016  . Acne 06/26/2010   Social History   Tobacco Use  . Smoking status: Never Smoker  . Smokeless tobacco: Never Used  Substance Use Topics  . Alcohol use: Yes  . Drug use: Not Currently   Current Medications and Allergies:   .  acetaminophen (TYLENOL) 500 MG tablet, Take 1,000 mg by mouth 3 (three) times daily as needed for fever., Disp: , Rfl:  .  amphetamine-dextroamphetamine (ADDERALL) 20 MG tablet, Take 20 mg by mouth 2 (two) times daily., Disp: , Rfl:  .  famotidine  (PEPCID) 10 MG tablet, Take 1 tablet (10 mg total) by mouth daily., Disp: 30 tablet, Rfl: 0 .  hydrOXYzine (ATARAX/VISTARIL) 25 MG tablet, Take 1 tablet (25 mg total) by mouth every 6 (six) hours. (Patient taking differently: Take 50 mg by mouth every 6 (six) hours. ), Disp: 12 tablet, Rfl: 0 .  minocycline (MINOCIN,DYNACIN) 100 MG capsule, Take 100 mg by mouth 2 (two) times daily., Disp: , Rfl:  .  Multiple Vitamin (MULTIVITAMIN) tablet, Take 1 tablet by mouth daily., Disp: , Rfl:  .  Vilazodone HCl (VIIBRYD) 40 MG TABS, Take 20 mg by mouth daily. , Disp: , Rfl:   No Known Allergies   Review of Systems   Pertinent items are noted in the HPI. Otherwise, a complete ROS is negative.  Vitals:   Vitals:   06/13/18 0906  BP: 110/72  Pulse: 84  Temp: 98.6 F (37 C)  TempSrc: Oral  SpO2: 98%  Weight: 267 lb 3.2 oz (121.2 kg)  Height: 6\' 5"  (1.956 m)     Body mass index is 31.69 kg/m.  Physical Exam:   Physical Exam Vitals signs and nursing note reviewed.  Constitutional:      General: He is not in acute distress.    Appearance: He is well-developed.  HENT:     Head: Normocephalic and atraumatic.     Right Ear: External ear normal.     Left Ear: External ear normal.     Nose: Nose normal.  Eyes:     Conjunctiva/sclera: Conjunctivae normal.     Pupils: Pupils are equal, round, and reactive to light.  Neck:     Musculoskeletal: Normal range of motion and neck supple.  Cardiovascular:     Rate and Rhythm: Normal rate and regular rhythm.     Heart sounds: Normal heart sounds.  Pulmonary:     Effort: Pulmonary effort is normal.     Breath sounds: Normal breath sounds.  Abdominal:     General: Bowel sounds are normal.     Palpations: Abdomen is soft.  Musculoskeletal: Normal range of motion.  Skin:    General: Skin is warm and dry.  Neurological:     Mental Status: He is alert and oriented to person, place, and time.  Psychiatric:        Behavior: Behavior normal.         Thought Content: Thought content normal.        Judgment: Judgment normal.     Results for orders placed or performed in visit on 06/13/18  POCT glycosylated hemoglobin (Hb A1C)  Result Value Ref Range   Hemoglobin A1C 5.4 4.0 - 5.6 %   HbA1c POC (<> result, manual entry)     HbA1c, POC (prediabetic range)     HbA1c, POC (controlled diabetic range)      Assessment and Plan:   Julian Paul was seen today for follow-up.  Diagnoses and all orders for this visit:  GAD (generalized anxiety disorder) Comments: Improving. Will continue with therapy. The patient is asked to make an attempt to improve diet and exercise patterns to aid in medical management of this problem.   Insulin resistance Comments: Improved.  Orders: -     POCT glycosylated hemoglobin (Hb A1C)  Attention deficit hyperactivity disorder (ADHD), combined type Comments: Imroving with stimulant. He has appointment with Psych on Jan 7. Will discuss change to extended release.   Screening for lipid disorders -     Lipid panel    . Orders and follow up as documented in [facility], reviewed diet, exercise and weight control, cardiovascular risk and specific lipid/LDL goals reviewed, reviewed medications and side effects in detail.  . Reviewed expectations re: course of current medical issues. . Outlined signs and symptoms indicating need for more acute intervention. . Patient verbalized understanding and all questions were answered. . Patient received an After Visit Summary.  Helane RimaErica Francee Setzer, DO , [facility] 06/13/2018

## 2018-06-13 ENCOUNTER — Encounter: Payer: Self-pay | Admitting: Family Medicine

## 2018-06-13 ENCOUNTER — Ambulatory Visit: Payer: BLUE CROSS/BLUE SHIELD | Admitting: Family Medicine

## 2018-06-13 VITALS — BP 110/72 | HR 84 | Temp 98.6°F | Ht 77.0 in | Wt 267.2 lb

## 2018-06-13 DIAGNOSIS — F902 Attention-deficit hyperactivity disorder, combined type: Secondary | ICD-10-CM | POA: Diagnosis not present

## 2018-06-13 DIAGNOSIS — Z13220 Encounter for screening for lipoid disorders: Secondary | ICD-10-CM | POA: Diagnosis not present

## 2018-06-13 DIAGNOSIS — F411 Generalized anxiety disorder: Secondary | ICD-10-CM | POA: Diagnosis not present

## 2018-06-13 DIAGNOSIS — E8881 Metabolic syndrome: Secondary | ICD-10-CM | POA: Diagnosis not present

## 2018-06-13 DIAGNOSIS — E88819 Insulin resistance, unspecified: Secondary | ICD-10-CM

## 2018-06-13 LAB — POCT GLYCOSYLATED HEMOGLOBIN (HGB A1C): Hemoglobin A1C: 5.4 % (ref 4.0–5.6)

## 2018-06-13 LAB — LIPID PANEL
Cholesterol: 193 mg/dL (ref 0–200)
HDL: 63.8 mg/dL (ref >39.00)
LDL Cholesterol: 114 mg/dL — ABNORMAL HIGH (ref 0–99)
NonHDL: 129.5
Total CHOL/HDL Ratio: 3
Triglycerides: 77 mg/dL (ref 0.0–149.0)
VLDL: 15.4 mg/dL (ref 0.0–40.0)

## 2018-06-13 NOTE — Patient Instructions (Signed)
Your A1c is in the normal range now, you lucky guy.  Exercise. Work on self-care.

## 2018-06-20 DIAGNOSIS — F3340 Major depressive disorder, recurrent, in remission, unspecified: Secondary | ICD-10-CM | POA: Diagnosis not present

## 2018-06-22 DIAGNOSIS — F3340 Major depressive disorder, recurrent, in remission, unspecified: Secondary | ICD-10-CM | POA: Diagnosis not present

## 2018-06-26 DIAGNOSIS — L83 Acanthosis nigricans: Secondary | ICD-10-CM | POA: Diagnosis not present

## 2018-06-26 DIAGNOSIS — F3340 Major depressive disorder, recurrent, in remission, unspecified: Secondary | ICD-10-CM | POA: Diagnosis not present

## 2018-06-26 DIAGNOSIS — Z23 Encounter for immunization: Secondary | ICD-10-CM | POA: Diagnosis not present

## 2018-06-27 ENCOUNTER — Encounter: Payer: Self-pay | Admitting: Family Medicine

## 2018-06-27 NOTE — Telephone Encounter (Signed)
Added to another open my chart.

## 2018-06-27 NOTE — Telephone Encounter (Signed)
Added from next message.   My previous message omitted that this pain in my lower back started right around when I last saw you on 12/31, plus or minus two days. It has become more frequent and more intense over time. Thanks again

## 2018-06-27 NOTE — Telephone Encounter (Signed)
Left message to return call to our office.  Pt needs app for evaluation

## 2018-06-28 ENCOUNTER — Encounter: Payer: Self-pay | Admitting: Family Medicine

## 2018-06-28 ENCOUNTER — Ambulatory Visit (INDEPENDENT_AMBULATORY_CARE_PROVIDER_SITE_OTHER): Payer: BLUE CROSS/BLUE SHIELD | Admitting: Family Medicine

## 2018-06-28 VITALS — BP 90/60 | HR 94 | Temp 98.4°F | Ht 72.0 in | Wt 269.2 lb

## 2018-06-28 DIAGNOSIS — R300 Dysuria: Secondary | ICD-10-CM

## 2018-06-28 DIAGNOSIS — S39012A Strain of muscle, fascia and tendon of lower back, initial encounter: Secondary | ICD-10-CM

## 2018-06-28 DIAGNOSIS — R81 Glycosuria: Secondary | ICD-10-CM

## 2018-06-28 LAB — POCT URINALYSIS DIPSTICK
Bilirubin, UA: NEGATIVE
Blood, UA: NEGATIVE
Glucose, UA: POSITIVE — AB
Ketones, UA: NEGATIVE
Leukocytes, UA: NEGATIVE
Nitrite, UA: NEGATIVE
Protein, UA: NEGATIVE
Spec Grav, UA: 1.02 (ref 1.010–1.025)
Urobilinogen, UA: 0.2 E.U./dL
pH, UA: 6 (ref 5.0–8.0)

## 2018-06-28 LAB — GLUCOSE, POCT (MANUAL RESULT ENTRY): POC Glucose: 105 mg/dl — AB (ref 70–99)

## 2018-06-28 MED ORDER — CYCLOBENZAPRINE HCL 5 MG PO TABS: 5.0000 mg | ORAL_TABLET | Freq: Every evening | ORAL | 0 refills | Status: DC | PRN

## 2018-06-28 NOTE — Patient Instructions (Signed)
Low Back Strain Rehab  Ask your health care provider which exercises are safe for you. Do exercises exactly as told by your health care provider and adjust them as directed. It is normal to feel mild stretching, pulling, tightness, or discomfort as you do these exercises, but you should stop right away if you feel sudden pain or your pain gets worse. Do not begin these exercises until told by your health care provider.  Stretching and range of motion exercises  These exercises warm up your muscles and joints and improve the movement and flexibility of your back. These exercises also help to relieve pain, numbness, and tingling.  Exercise A: Single knee to chest    1. Lie on your back on a firm surface with both legs straight.  2. Bend one of your knees. Use your hands to move your knee up toward your chest until you feel a gentle stretch in your lower back and buttock.  ? Hold your leg in this position by holding onto the front of your knee.  ? Keep your other leg as straight as possible.  3. Hold for __________ seconds.  4. Slowly return to the starting position.  5. Repeat with your other leg.  Repeat __________ times. Complete this exercise __________ times a day.  Exercise B: Prone extension on elbows    1. Lie on your abdomen on a firm surface.  2. Prop yourself up on your elbows.  3. Use your arms to help lift your chest up until you feel a gentle stretch in your abdomen and your lower back.  ? This will place some of your body weight on your elbows. If this is uncomfortable, try stacking pillows under your chest.  ? Your hips should stay down, against the surface that you are lying on. Keep your hip and back muscles relaxed.  4. Hold for __________ seconds.  5. Slowly relax your upper body and return to the starting position.  Repeat __________ times. Complete this exercise __________ times a day.  Strengthening exercises  These exercises build strength and endurance in your back. Endurance is the ability to  use your muscles for a long time, even after they get tired.  Exercise C: Pelvic tilt  1. Lie on your back on a firm surface. Bend your knees and keep your feet flat.  2. Tense your abdominal muscles. Tip your pelvis up toward the ceiling and flatten your lower back into the floor.  ? To help with this exercise, you may place a small towel under your lower back and try to push your back into the towel.  3. Hold for __________ seconds.  4. Let your muscles relax completely before you repeat this exercise.  Repeat __________ times. Complete this exercise __________ times a day.  Exercise D: Alternating arm and leg raises    1. Get on your hands and knees on a firm surface. If you are on a hard floor, you may want to use padding to cushion your knees, such as an exercise mat.  2. Line up your arms and legs. Your hands should be below your shoulders, and your knees should be below your hips.  3. Lift your left leg behind you. At the same time, raise your right arm and straighten it in front of you.  ? Do not lift your leg higher than your hip.  ? Do not lift your arm higher than your shoulder.  ? Keep your abdominal and back muscles tight.  ?   Keep your hips facing the ground.  ? Do not arch your back.  ? Keep your balance carefully, and do not hold your breath.  4. Hold for __________ seconds.  5. Slowly return to the starting position and repeat with your right leg and your left arm.  Repeat __________ times. Complete this exercise __________times a day.  Exercise J: Single leg lower with bent knees  1. Lie on your back on a firm surface.  2. Tense your abdominal muscles and lift your feet off the floor, one foot at a time, so your knees and hips are bent in an "L" shape (at about 90 degrees).  ? Your knees should be over your hips and your lower legs should be parallel to the floor.  3. Keeping your abdominal muscles tense and your knee bent, slowly lower one of your legs so your toe touches the ground.  4. Lift your  leg back up to return to the starting position.  ? Do not hold your breath.  ? Do not let your back arch. Keep your back flat against the ground.  5. Repeat with your other leg.  Repeat __________ times. Complete this exercise __________ times a day.  Posture and body mechanics    Body mechanics refers to the movements and positions of your body while you do your daily activities. Posture is part of body mechanics. Good posture and healthy body mechanics can help to relieve stress in your body's tissues and joints. Good posture means that your spine is in its natural S-curve position (your spine is neutral), your shoulders are pulled back slightly, and your head is not tipped forward. The following are general guidelines for applying improved posture and body mechanics to your everyday activities.  Standing     When standing, keep your spine neutral and your feet about hip-width apart. Keep a slight bend in your knees. Your ears, shoulders, and hips should line up.   When you do a task in which you stand in one place for a long time, place one foot up on a stable object that is 2-4 inches (5-10 cm) high, such as a footstool. This helps keep your spine neutral.  Sitting     When sitting, keep your spine neutral and keep your feet flat on the floor. Use a footrest, if necessary, and keep your thighs parallel to the floor. Avoid rounding your shoulders, and avoid tilting your head forward.   When working at a desk or a computer, keep your desk at a height where your hands are slightly lower than your elbows. Slide your chair under your desk so you are close enough to maintain good posture.   When working at a computer, place your monitor at a height where you are looking straight ahead and you do not have to tilt your head forward or downward to look at the screen.  Resting     When lying down and resting, avoid positions that are most painful for you.   If you have pain with activities such as sitting, bending,  stooping, or squatting (flexion-based activities), lie in a position in which your body does not bend very much. For example, avoid curling up on your side with your arms and knees near your chest (fetal position).   If you have pain with activities such as standing for a long time or reaching with your arms (extension-based activities), lie with your spine in a neutral position and bend your knees slightly. Try the   following positions:  ? Lying on your side with a pillow between your knees.  ? Lying on your back with a pillow under your knees.  Lifting     When lifting objects, keep your feet at least shoulder-width apart and tighten your abdominal muscles.   Bend your knees and hips and keep your spine neutral. It is important to lift using the strength of your legs, not your back. Do not lock your knees straight out.   Always ask for help to lift heavy or awkward objects.  This information is not intended to replace advice given to you by your health care provider. Make sure you discuss any questions you have with your health care provider.  Document Released: 05/31/2005 Document Revised: 02/05/2016 Document Reviewed: 03/12/2015  Elsevier Interactive Patient Education  2019 [employer]

## 2018-06-28 NOTE — Progress Notes (Signed)
Julian Paul is a 23 y.o. male here for an acute visit.  History of Present Illness:   Julian Paul,acting as a scribe for Helane Rima, DO.,have documented all relevant documentation on the behalf of Helane Rima, DO,as directed by  Helane Rima, DO while in the presence of Helane Rima, DO.   Back Pain  This is a new problem. The current episode started more than 1 month ago. The problem occurs intermittently. The problem has been gradually worsening since onset. The pain is present in the lumbar spine (Right side). The quality of the pain is described as cramping (hurts more urinate nonconsistent). The pain does not radiate. The pain is at a severity of 5/10. The pain is mild. The pain is the same all the time. The symptoms are aggravated by bending, lying down, sitting, twisting, standing and position. Associated symptoms include bladder incontinence, headaches and tingling. (Some burning) Treatments tried: massage  The treatment provided no relief.    PMHx, SurgHx, SocialHx, Medications, and Allergies were reviewed in the Visit Navigator and updated as appropriate.  Current Medications:   Current Outpatient Medications:  .  acetaminophen (TYLENOL) 500 MG tablet, Take 1,000 mg by mouth 3 (three) times daily as needed for fever., Disp: , Rfl:  .  amphetamine-dextroamphetamine (ADDERALL) 20 MG tablet, Take 20 mg by mouth 2 (two) times daily., Disp: , Rfl:  .  clindamycin (CLINDAGEL) 1 % gel, Apply topically 2 (two) times daily., Disp: 30 g, Rfl: 5 .  famotidine (PEPCID) 10 MG tablet, Take 1 tablet (10 mg total) by mouth daily., Disp: 30 tablet, Rfl: 0 .  hydrOXYzine (ATARAX/VISTARIL) 25 MG tablet, Take 1 tablet (25 mg total) by mouth every 6 (six) hours. (Patient taking differently: Take 50 mg by mouth every 6 (six) hours. ), Disp: 12 tablet, Rfl: 0 .  minocycline (MINOCIN,DYNACIN) 100 MG capsule, Take 100 mg by mouth 2 (two) times daily., Disp: , Rfl:  .  Multiple Vitamin  (MULTIVITAMIN) tablet, Take 1 tablet by mouth daily., Disp: , Rfl:  .  Vilazodone HCl (VIIBRYD) 40 MG TABS, Take 20 mg by mouth daily. , Disp: , Rfl:    No Known Allergies Review of Systems:   Pertinent items are noted in the HPI. Otherwise, ROS is negative.  Vitals:   Vitals:   06/28/18 1616  BP: 90/60  Pulse: 94  Temp: 98.4 F (36.9 C)  TempSrc: Oral  SpO2: 97%  Weight: 269 lb 3.2 oz (122.1 kg)  Height: 6' (1.829 m)     Body mass index is 36.51 kg/m.  Physical Exam:   Physical Exam Vitals signs and nursing note reviewed.  Constitutional:      General: He is not in acute distress.    Appearance: He is well-developed.  HENT:     Head: Normocephalic and atraumatic.     Right Ear: External ear normal.     Left Ear: External ear normal.     Nose: Nose normal.  Eyes:     Conjunctiva/sclera: Conjunctivae normal.     Pupils: Pupils are equal, round, and reactive to light.  Neck:     Musculoskeletal: Neck supple.  Cardiovascular:     Rate and Rhythm: Normal rate and regular rhythm.  Pulmonary:     Effort: Pulmonary effort is normal.  Abdominal:     General: Bowel sounds are normal.     Palpations: Abdomen is soft.  Musculoskeletal:     Lumbar back: He exhibits decreased range of motion,  tenderness and spasm.       Back:  Skin:    General: Skin is warm.  Neurological:     Mental Status: He is alert.  Psychiatric:        Behavior: Behavior normal.      Results for orders placed or performed in visit on 06/28/18  POCT Urinalysis Dipstick  Result Value Ref Range   Color, UA Yellow    Clarity, UA Clear    Glucose, UA Positive (A) Negative   Bilirubin, UA Negative    Ketones, UA Negative    Spec Grav, UA 1.020 1.010 - 1.025   Blood, UA Negative    pH, UA 6.0 5.0 - 8.0   Protein, UA Negative Negative   Urobilinogen, UA 0.2 0.2 or 1.0 E.U./dL   Nitrite, UA Negative    Leukocytes, UA Negative Negative   Appearance     Odor    POC Glucose (CBG)  Result  Value Ref Range   POC Glucose 105 (A) 70 - 99 mg/dl    Assessment and Plan:   Julian Paul was seen today for back pain.  Diagnoses and all orders for this visit:  Strain of lumbar region, initial encounter -     cyclobenzaprine (FLEXERIL) 5 MG tablet; Take 1 tablet (5 mg total) by mouth at bedtime as needed for muscle spasms.  Dysuria -     POCT Urinalysis Dipstick  Glucosuria -     POC Glucose (CBG)    . Reviewed expectations re: course of current medical issues. . Discussed self-management of symptoms. . Outlined signs and symptoms indicating need for more acute intervention. . Patient verbalized understanding and all questions were answered. Marland Kitchen. Health Maintenance issues including appropriate healthy diet, exercise, and smoking avoidance were discussed with patient. . See orders for this visit as documented in the electronic medical record. . Patient received an After Visit Summary.  CMA served as Neurosurgeonscribe during this visit. History, Physical, and Plan performed by medical provider. The above documentation has been reviewed and is accurate and complete. Helane RimaErica Pat Sires, D.O.  Helane RimaErica Sharona Rovner, DO [place], [facility] 06/29/2018

## 2018-07-03 DIAGNOSIS — F3340 Major depressive disorder, recurrent, in remission, unspecified: Secondary | ICD-10-CM | POA: Diagnosis not present

## 2018-07-11 DIAGNOSIS — F3340 Major depressive disorder, recurrent, in remission, unspecified: Secondary | ICD-10-CM | POA: Diagnosis not present

## 2018-07-17 DIAGNOSIS — F3340 Major depressive disorder, recurrent, in remission, unspecified: Secondary | ICD-10-CM | POA: Diagnosis not present

## 2018-07-25 DIAGNOSIS — F3340 Major depressive disorder, recurrent, in remission, unspecified: Secondary | ICD-10-CM | POA: Diagnosis not present

## 2018-08-01 DIAGNOSIS — F3340 Major depressive disorder, recurrent, in remission, unspecified: Secondary | ICD-10-CM | POA: Diagnosis not present

## 2018-08-14 DIAGNOSIS — F3340 Major depressive disorder, recurrent, in remission, unspecified: Secondary | ICD-10-CM | POA: Diagnosis not present

## 2018-08-24 DIAGNOSIS — F3340 Major depressive disorder, recurrent, in remission, unspecified: Secondary | ICD-10-CM | POA: Diagnosis not present

## 2018-08-28 DIAGNOSIS — F3340 Major depressive disorder, recurrent, in remission, unspecified: Secondary | ICD-10-CM | POA: Diagnosis not present

## 2018-09-07 DIAGNOSIS — F3340 Major depressive disorder, recurrent, in remission, unspecified: Secondary | ICD-10-CM | POA: Diagnosis not present

## 2018-09-14 DIAGNOSIS — F3340 Major depressive disorder, recurrent, in remission, unspecified: Secondary | ICD-10-CM | POA: Diagnosis not present

## 2018-09-22 DIAGNOSIS — F3340 Major depressive disorder, recurrent, in remission, unspecified: Secondary | ICD-10-CM | POA: Diagnosis not present

## 2018-09-27 ENCOUNTER — Encounter: Payer: Self-pay | Admitting: Family Medicine

## 2018-09-27 ENCOUNTER — Ambulatory Visit (INDEPENDENT_AMBULATORY_CARE_PROVIDER_SITE_OTHER): Payer: BLUE CROSS/BLUE SHIELD | Admitting: Physician Assistant

## 2018-09-27 ENCOUNTER — Encounter: Payer: Self-pay | Admitting: Physician Assistant

## 2018-09-27 DIAGNOSIS — J029 Acute pharyngitis, unspecified: Secondary | ICD-10-CM

## 2018-09-27 MED ORDER — AMOXICILLIN 875 MG PO TABS: 875.0000 mg | ORAL_TABLET | Freq: Two times a day (BID) | ORAL | 0 refills | Status: DC

## 2018-09-27 NOTE — Progress Notes (Signed)
Virtual Visit via Video   I connected with Julian Paul on 09/27/18 at  1:40 PM EDT by a video enabled telemedicine application and verified that I am speaking with the correct person using two identifiers. Location patient: Home Location provider:  [facility], Office Persons participating in the virtual visit: Julian Paul, Jarold Motto, PA, Jarold Motto, [place]   I discussed the limitations of evaluation and management by telemedicine and the availability of in person appointments. The patient expressed understanding and agreed to proceed.  Subjective:  I Julian Mull, LPN acted as a Neurosurgeon for [employer], PA-C  HPI:  Sore throat Pt c/o sore throat since Monday, difficulty swallowing. Denies fever, chills, or dizziness, some headaches off and on. Pt has not taken any taken medications. Pt does have allergies. Has not taken anti-histamine. He has had strep before and feels like he has this now. Denies sick contacts.  ROS: See pertinent positives and negatives per HPI.  Patient Active Problem List   Diagnosis Date Noted  . GAD (generalized anxiety disorder) 04/12/2018  . Mood disorder (HCC) 04/12/2018  . Seborrheic dermatitis, unspecified 02/14/2018  . Atopic eczema 12/29/2016  . Acne 06/26/2010    Social History   Tobacco Use  . Smoking status: Never Smoker  . Smokeless tobacco: Never Used  Substance Use Topics  . Alcohol use: Yes    Current Outpatient Medications:  .  amphetamine-dextroamphetamine (ADDERALL XR) 30 MG 24 hr capsule, Take 30 mg by mouth daily., Disp: , Rfl:  .  clindamycin (CLINDAGEL) 1 % gel, Apply topically 2 (two) times daily., Disp: 30 g, Rfl: 5 .  cyclobenzaprine (FLEXERIL) 5 MG tablet, Take 1 tablet (5 mg total) by mouth at bedtime as needed for muscle spasms., Disp: 20 tablet, Rfl: 0 .  hydrOXYzine (ATARAX/VISTARIL) 50 MG tablet, Take 50 mg by mouth at bedtime., Disp: , Rfl:  .  minocycline (MINOCIN,DYNACIN) 100 MG capsule, Take 100 mg by  mouth 2 (two) times daily., Disp: , Rfl:  .  Multiple Vitamin (MULTIVITAMIN) tablet, Take 1 tablet by mouth daily., Disp: , Rfl:  .  Vilazodone HCl (VIIBRYD) 40 MG TABS, Take 20 mg by mouth daily. , Disp: , Rfl:  .  amoxicillin (AMOXIL) 875 MG tablet, Take 1 tablet (875 mg total) by mouth 2 (two) times daily., Disp: 20 tablet, Rfl: 0 .  famotidine (PEPCID) 10 MG tablet, Take 1 tablet (10 mg total) by mouth daily. (Patient not taking: Reported on 09/27/2018), Disp: 30 tablet, Rfl: 0 .  fluocinonide ointment (LIDEX) 0.05 %, APPLY TO AFFECTED AREA TWICE A DAY FOR 14 DAYS, Disp: , Rfl:   No Known Allergies  Objective:   VITALS: Per patient if applicable, see vitals. GENERAL: Alert, appears well and in no acute distress. HEENT: Atraumatic, conjunctiva clear, no obvious abnormalities on inspection of external nose and ears. NECK: Normal movements of the head and neck. CARDIOPULMONARY: No increased WOB. Speaking in clear sentences. I:E ratio WNL.  MS: Moves all visible extremities without noticeable abnormality. PSYCH: Pleasant and cooperative, well-groomed. Speech normal rate and rhythm. Affect is appropriate. Insight and judgement are appropriate. Attention is focused, linear, and appropriate.  NEURO: CN grossly intact. Oriented as arrived to appointment on time with no prompting. Moves both UE equally.  SKIN: No obvious lesions, wounds, erythema, or cyanosis noted on face or hands.  Assessment and Plan:   Julian Paul was seen today for sore throat.  Diagnoses and all orders for this visit:  Pharyngitis, unspecified etiology  Other orders -     amoxicillin (AMOXIL) 875 MG tablet; Take 1 tablet (875 mg total) by mouth 2 (two) times daily.   No red flags on exam.  Will initiate amoxicillin per orders to cover for possible strep throat. Hold minocycline while on this. Recommended ibuprofen for pain prn. Discussed taking medications as prescribed. Reviewed return precautions including worsening fever,  SOB, worsening cough or other concerns. Push fluids and rest. I recommend that patient follow-up if symptoms worsen or persist despite treatment x 7-10 days, sooner if needed.   . Reviewed expectations re: course of current medical issues. . Discussed self-management of symptoms. . Outlined signs and symptoms indicating need for more acute intervention. . Patient verbalized understanding and all questions were answered. Marland Kitchen. Health Maintenance issues including appropriate healthy diet, exercise, and smoking avoidance were discussed with patient. . See orders for this visit as documented in the electronic medical record.  I discussed the assessment and treatment plan with the patient. The patient was provided an opportunity to ask questions and all were answered. The patient agreed with the plan and demonstrated an understanding of the instructions.   The patient was advised to call back or seek an in-person evaluation if the symptoms worsen or if the condition fails to improve as anticipated.  CMA or LPN served as scribe during this visit. History, Physical, and Plan performed by medical provider. The above documentation has been reviewed and is accurate and complete.   [place] Dolores [place], [place] 09/27/2018

## 2018-09-27 NOTE — Telephone Encounter (Signed)
Called pt and scheduled virtual visit with Jarold Motto today at 1:40 PM.

## 2018-09-29 DIAGNOSIS — F3340 Major depressive disorder, recurrent, in remission, unspecified: Secondary | ICD-10-CM | POA: Diagnosis not present

## 2018-10-02 ENCOUNTER — Encounter: Payer: Self-pay | Admitting: Physician Assistant

## 2018-10-03 ENCOUNTER — Encounter: Payer: Self-pay | Admitting: Physician Assistant

## 2018-10-03 ENCOUNTER — Ambulatory Visit (INDEPENDENT_AMBULATORY_CARE_PROVIDER_SITE_OTHER): Payer: BLUE CROSS/BLUE SHIELD | Admitting: Physician Assistant

## 2018-10-03 ENCOUNTER — Ambulatory Visit: Payer: BLUE CROSS/BLUE SHIELD | Admitting: Family Medicine

## 2018-10-03 DIAGNOSIS — J029 Acute pharyngitis, unspecified: Secondary | ICD-10-CM | POA: Diagnosis not present

## 2018-10-03 NOTE — Progress Notes (Signed)
Virtual Visit via Video   I connected with Julian Paul on 10/03/18 at 10:00 AM EDT by a video enabled telemedicine application and verified that I am speaking with the correct person using two identifiers. Location patient: Home Location provider: [facility], Office Persons participating in the virtual visit: Julian Paul, Julian Motto, PA-C   I discussed the limitations of evaluation and management by telemedicine and the availability of in person appointments. The patient expressed understanding and agreed to proceed.  Subjective:   HPI:  Sore throat Pt following up today from visit 4/15. Pt still c/o sore throat x 7 days worsened over the weekend, pain little better now,  started antibiotic Amoxicillin on Wednesday. Girlfriend who lives with him started having symptoms on Thursday and has felt normal since Sunday.  Overall he is improving. He has been checking his temperature and has not had fever. He is taking ibuprofen occasionally, but not regularly. Tolerating abx well.  Denies: cough, chest pain, SOB, fatigue  ROS: See pertinent positives and negatives per HPI.  Patient Active Problem List   Diagnosis Date Noted  . GAD (generalized anxiety disorder) 04/12/2018  . Mood disorder (HCC) 04/12/2018  . Seborrheic dermatitis, unspecified 02/14/2018  . Atopic eczema 12/29/2016  . Acne 06/26/2010    Social History   Tobacco Use  . Smoking status: Never Smoker  . Smokeless tobacco: Never Used  Substance Use Topics  . Alcohol use: Yes    Current Outpatient Medications:  .  amoxicillin (AMOXIL) 875 MG tablet, Take 1 tablet (875 mg total) by mouth 2 (two) times daily., Disp: 20 tablet, Rfl: 0 .  amphetamine-dextroamphetamine (ADDERALL XR) 30 MG 24 hr capsule, Take 30 mg by mouth daily., Disp: , Rfl:  .  clindamycin (CLINDAGEL) 1 % gel, Apply topically 2 (two) times daily., Disp: 30 g, Rfl: 5 .  cyclobenzaprine (FLEXERIL) 5 MG tablet, Take 1 tablet (5 mg total) by mouth at  bedtime as needed for muscle spasms., Disp: 20 tablet, Rfl: 0 .  famotidine (PEPCID) 10 MG tablet, Take 1 tablet (10 mg total) by mouth daily., Disp: 30 tablet, Rfl: 0 .  fluocinonide ointment (LIDEX) 0.05 %, APPLY TO AFFECTED AREA TWICE A DAY FOR 14 DAYS, Disp: , Rfl:  .  hydrOXYzine (ATARAX/VISTARIL) 50 MG tablet, Take 50 mg by mouth at bedtime., Disp: , Rfl:  .  minocycline (MINOCIN,DYNACIN) 100 MG capsule, Take 100 mg by mouth 2 (two) times daily., Disp: , Rfl:  .  Multiple Vitamin (MULTIVITAMIN) tablet, Take 1 tablet by mouth daily., Disp: , Rfl:  .  Vilazodone HCl (VIIBRYD) 40 MG TABS, Take 20 mg by mouth daily. , Disp: , Rfl:   No Known Allergies  Objective:   VITALS: Per patient if applicable, see vitals. GENERAL: Alert, appears well and in no acute distress. HEENT: Atraumatic, conjunctiva clear, no obvious abnormalities on inspection of external nose and ears. NECK: Normal movements of the head and neck. CARDIOPULMONARY: No increased WOB. Speaking in clear sentences. I:E ratio WNL.  MS: Moves all visible extremities without noticeable abnormality. PSYCH: Pleasant and cooperative, well-groomed. Speech normal rate and rhythm. Affect is appropriate. Insight and judgement are appropriate. Attention is focused, linear, and appropriate.  NEURO: CN grossly intact. Oriented as arrived to appointment on time with no prompting. Moves both UE equally.  SKIN: No obvious lesions, wounds, erythema, or cyanosis noted on face or hands.  *Picture sent from mychart shows 1-2 + tonsils with erythema, no significant exudate or asymmetry  Assessment and Plan:   Julian Paul was seen today for sore throat.  Diagnoses and all orders for this visit:  Pharyngitis, unspecified etiology   Overall improving clinically. Continue current regimen of Amoxicillin. Discussed that if he develops new symptoms or at completion of abx has no further improvement of symptoms, we may need to change abx. Red flags  reviewed.   . Reviewed expectations re: course of current medical issues. . Discussed self-management of symptoms. . Outlined signs and symptoms indicating need for more acute intervention. . Patient verbalized understanding and all questions were answered. Marland Kitchen. Health Maintenance issues including appropriate healthy diet, exercise, and smoking avoidance were discussed with patient. . See orders for this visit as documented in the electronic medical record.  I discussed the assessment and treatment plan with the patient. The patient was provided an opportunity to ask questions and all were answered. The patient agreed with the plan and demonstrated an understanding of the instructions.   The patient was advised to call back or seek an in-person evaluation if the symptoms worsen or if the condition fails to improve as anticipated.  CMA or LPN served as scribe during this visit. History, Physical, and Plan performed by medical provider. The above documentation has been reviewed and is accurate and complete.  AdamsSamantha Kalyiah Saintil, [place] 10/03/2018

## 2018-10-05 DIAGNOSIS — F3340 Major depressive disorder, recurrent, in remission, unspecified: Secondary | ICD-10-CM | POA: Diagnosis not present

## 2018-10-10 ENCOUNTER — Encounter: Payer: Self-pay | Admitting: Physician Assistant

## 2018-10-12 DIAGNOSIS — F3340 Major depressive disorder, recurrent, in remission, unspecified: Secondary | ICD-10-CM | POA: Diagnosis not present

## 2018-10-13 ENCOUNTER — Other Ambulatory Visit: Payer: Self-pay

## 2018-10-13 ENCOUNTER — Ambulatory Visit (INDEPENDENT_AMBULATORY_CARE_PROVIDER_SITE_OTHER): Payer: BLUE CROSS/BLUE SHIELD | Admitting: Family Medicine

## 2018-10-13 VITALS — Ht 72.0 in | Wt 269.0 lb

## 2018-10-13 DIAGNOSIS — R0982 Postnasal drip: Secondary | ICD-10-CM | POA: Diagnosis not present

## 2018-10-13 DIAGNOSIS — J301 Allergic rhinitis due to pollen: Secondary | ICD-10-CM

## 2018-10-13 DIAGNOSIS — J029 Acute pharyngitis, unspecified: Secondary | ICD-10-CM

## 2018-10-13 MED ORDER — FLUTICASONE PROPIONATE 50 MCG/ACT NA SUSP: 2.0000 | Freq: Every day | NASAL | 6 refills | Status: AC

## 2018-10-13 NOTE — Patient Instructions (Signed)
Take Allegra at night and Flonase in the morning. You should start seeing improvement in 4-6 days. If you do not get help with that let us know and we will try another option.

## 2018-10-13 NOTE — Progress Notes (Signed)
Virtual Visit via Video   Due to the COVID-19 pandemic, this visit was completed with telemedicine (audio/video) technology to reduce patient and provider exposure as well as to preserve personal protective equipment.   I connected with Julian Paul by a video enabled telemedicine application and verified that I am speaking with the correct person using two identifiers. Location patient: Home Location provider: [facility], Office Persons participating in the virtual visit: ROSHAD TILLEMA, Helane Rima, DO Barnie Mort, CMA acting as scribe for Dr. Helane Rima.   I discussed the limitations of evaluation and management by telemedicine and the availability of in person appointments. The patient expressed understanding and agreed to proceed.  Care Team   Patient Care Team: Helane Rima, DO as PCP - General (Family Medicine) Lauris Poag, MD as Consulting Physician (Nephrology) Earleen Reaper, Marisa Hua, MD as Consulting Physician (Dermatology) Patkar, Starr Lake, MD as Consulting Physician (Psychiatry) Dionne Bucy Gypsy Decant, MD as Referring Physician (Internal Medicine) Carolynn Sayers, MD as Referring Physician (Pediatrics) Elmon Else, MD as Consulting Physician (Dermatology)  Subjective:   HPI: Patient in for follow up on sore throat.  From last note from 10/03/2018 with Samantha  Sore throat Pt following up today from visit 4/15. Pt still c/o sore throat x 7 days worsened over the weekend, pain little better now,  started antibiotic Amoxicillin on Wednesday. Girlfriend who lives with him started having symptoms on Thursday and has felt normal since Sunday.  Today he still feels like he has some soreness and irritation. Becomes more difficult when he talks more. He states that it will come and go. Not very painful just irritated. He has noticed that it is more in the morning when he wakes up. He is having to clear his throat during the day. He has not had any reflux. He has  not had any runny nose but has had increased symptoms about three weeks ago with allergies.   Review of Systems  Constitutional: Negative for chills, fever, malaise/fatigue and weight loss.  HENT: Positive for sore throat.   Respiratory: Negative for cough, shortness of breath and wheezing.   Cardiovascular: Negative for chest pain, palpitations and leg swelling.  Gastrointestinal: Negative for abdominal pain, constipation, diarrhea, nausea and vomiting.  Genitourinary: Negative for dysuria and urgency.  Musculoskeletal: Negative for joint pain and myalgias.  Skin: Negative for rash.  Neurological: Negative for dizziness and headaches.  Psychiatric/Behavioral: Negative for depression, substance abuse and suicidal ideas. The patient is not nervous/anxious.      Patient Active Problem List   Diagnosis Date Noted  . GAD (generalized anxiety disorder) 04/12/2018  . Mood disorder (HCC) 04/12/2018  . Seborrheic dermatitis, unspecified 02/14/2018  . GERD (gastroesophageal reflux disease) 04/28/2017  . Atopic eczema 12/29/2016  . Acne 06/26/2010    Social History   Tobacco Use  . Smoking status: Never Smoker  . Smokeless tobacco: Never Used  Substance Use Topics  . Alcohol use: Yes    Current Outpatient Medications:  .  amphetamine-dextroamphetamine (ADDERALL XR) 30 MG 24 hr capsule, Take 30 mg by mouth daily., Disp: , Rfl:  .  clindamycin (CLINDAGEL) 1 % gel, Apply topically 2 (two) times daily., Disp: 30 g, Rfl: 5 .  famotidine (PEPCID) 10 MG tablet, Take 1 tablet (10 mg total) by mouth daily., Disp: 30 tablet, Rfl: 0 .  fluocinonide ointment (LIDEX) 0.05 %, APPLY TO AFFECTED AREA TWICE A DAY FOR 14 DAYS, Disp: , Rfl:  .  fluticasone (FLONASE) 50  MCG/ACT nasal spray, Place 2 sprays into both nostrils daily., Disp: 16 g, Rfl: 6 .  hydrOXYzine (ATARAX/VISTARIL) 50 MG tablet, Take 50 mg by mouth at bedtime., Disp: , Rfl:  .  minocycline (MINOCIN,DYNACIN) 100 MG capsule, Take 100 mg by  mouth 2 (two) times daily., Disp: , Rfl:  .  Multiple Vitamin (MULTIVITAMIN) tablet, Take 1 tablet by mouth daily., Disp: , Rfl:  .  Vilazodone HCl (VIIBRYD) 40 MG TABS, Take 20 mg by mouth daily. , Disp: , Rfl:   No Known Allergies  Objective:   VITALS: Per patient if applicable, see vitals. GENERAL: Alert, appears well and in no acute distress. HEENT: Atraumatic, conjunctiva clear, no obvious abnormalities on inspection of external nose and ears. OP pale, PND. NECK: Normal movements of the head and neck. CARDIOPULMONARY: No increased WOB. Speaking in clear sentences. I:E ratio WNL.  MS: Moves all visible extremities without noticeable abnormality. PSYCH: Pleasant and cooperative, well-groomed. Speech normal rate and rhythm. Affect is appropriate. Insight and judgement are appropriate. Attention is focused, linear, and appropriate.  NEURO: CN grossly intact. Oriented as arrived to appointment on time with no prompting. Moves both UE equally.  SKIN: No obvious lesions, wounds, erythema, or cyanosis noted on face or hands.  Depression screen [facility] 2/9 01/17/2018 10/07/2017  Decreased Interest 2 0  Down, Depressed, Hopeless 2 0  PHQ - 2 Score 4 0  Altered sleeping 0 1  Tired, decreased energy 1 0  Change in appetite 0 0  Feeling bad or failure about yourself  2 0  Trouble concentrating 1 0  Moving slowly or fidgety/restless 0 3  Suicidal thoughts 0 0  PHQ-9 Score 8 4  Difficult doing work/chores Somewhat difficult Somewhat difficult    Assessment and Plan:   Julian Paul was seen today for follow-up.  Diagnoses and all orders for this visit:  Seasonal allergic rhinitis due to pollen Comments: He will start an antihistamine.   PND (post-nasal drip) -     fluticasone (FLONASE) 50 MCG/ACT nasal spray; Place 2 sprays into both nostrils daily.  Throat soreness Comments: Not c/w infection. Will treat allergies and PND.     Marland Kitchen. COVID-19 Education: The signs and symptoms of COVID-19 were  discussed with the patient and how to seek care for testing if needed. The importance of social distancing was discussed today. . Reviewed expectations re: course of current medical issues. . Discussed self-management of symptoms. . Outlined signs and symptoms indicating need for more acute intervention. . Patient verbalized understanding and all questions were answered. Marland Kitchen. Health Maintenance issues including appropriate healthy diet, exercise, and smoking avoidance were discussed with patient. . See orders for this visit as documented in the electronic medical record.  Helane RimaErica Mckenzye Cutright, DO  Records requested if needed. Time spent: 25 minutes, of which >50% was spent in obtaining information about his symptoms, reviewing his previous labs, evaluations, and treatments, counseling him about his condition (please see the discussed topics above), and developing a plan to further investigate it; he had a number of questions which I addressed.

## 2018-10-15 ENCOUNTER — Encounter: Payer: Self-pay | Admitting: Family Medicine

## 2018-10-19 DIAGNOSIS — F3340 Major depressive disorder, recurrent, in remission, unspecified: Secondary | ICD-10-CM | POA: Diagnosis not present

## 2018-10-25 DIAGNOSIS — F3340 Major depressive disorder, recurrent, in remission, unspecified: Secondary | ICD-10-CM | POA: Diagnosis not present

## 2018-10-26 DIAGNOSIS — F3340 Major depressive disorder, recurrent, in remission, unspecified: Secondary | ICD-10-CM | POA: Diagnosis not present

## 2018-10-30 ENCOUNTER — Ambulatory Visit: Payer: BLUE CROSS/BLUE SHIELD | Admitting: Family Medicine

## 2018-10-30 NOTE — Progress Notes (Signed)
Virtual Visit via Video   Due to the COVID-19 pandemic, this visit was completed with telemedicine (audio/video) technology to reduce patient and provider exposure as well as to preserve personal protective equipment.   I connected with Julian Paul by a video enabled telemedicine application and verified that I am speaking with the correct person using two identifiers. Location patient: Home Location provider: [facility], Office Persons participating in the virtual visit: Julian Paul, Helane Rima, DO Barnie Mort, CMA acting as scribe for Dr. Helane Rima.   I discussed the limitations of evaluation and management by telemedicine and the availability of in person appointments. The patient expressed understanding and agreed to proceed.  Care Team   Patient Care Team: Helane Rima, DO as PCP - General (Family Medicine) Lauris Poag, MD as Consulting Physician (Nephrology) Earleen Reaper, Marisa Hua, MD as Consulting Physician (Dermatology) Patkar, Starr Lake, MD as Consulting Physician (Psychiatry) Dionne Bucy Gypsy Decant, MD as Referring Physician (Internal Medicine) Carolynn Sayers, MD as Referring Physician (Pediatrics) Elmon Else, MD as Consulting Physician (Dermatology)  Subjective:   HPI:   GAD (generalized anxiety disorder) Defered to Psychiatry; asked him to discuss with them. Abilify is helping mood and sleep. Taking Hydroxyzine and Viibyrd.  Insulin resistance Checking BG regularly. Repots occasional sx of hypoglycemia but BG is in normal range. He thinks it may be related to dehydration. Has been working in increasing fluid intake.    Attention deficit hyperactivity disorder (ADHD), combined type  Has d/c Adderall XR 30 mg and replaced with Vyvanse 40 mg on 10/26/18. He is tolerating this well, reports that it is more "smooth". He has been maintaining his weight.   Seasonal allergies Using Flonase BID and taking Zyrtec with good relief. He denies  breathing restrictions/wheezing. Sometimes he loses his voice.   Review of Systems  Constitutional: Negative for chills, fever, malaise/fatigue and weight loss.  Respiratory: Negative for cough, shortness of breath and wheezing.   Cardiovascular: Negative for chest pain, palpitations and leg swelling.  Gastrointestinal: Negative for abdominal pain, constipation, diarrhea, nausea and vomiting.  Genitourinary: Negative for dysuria and urgency.  Musculoskeletal: Negative for joint pain and myalgias.  Skin: Negative for rash.  Neurological: Negative for dizziness and headaches.  Psychiatric/Behavioral: Negative for depression, substance abuse and suicidal ideas. The patient is not nervous/anxious.     Patient Active Problem List   Diagnosis Date Noted  . GAD (generalized anxiety disorder) 04/12/2018  . Mood disorder (HCC) 04/12/2018  . Seborrheic dermatitis, unspecified 02/14/2018  . GERD (gastroesophageal reflux disease) 04/28/2017  . Atopic eczema 12/29/2016  . Acne 06/26/2010    Social History   Tobacco Use  . Smoking status: Never Smoker  . Smokeless tobacco: Never Used  Substance Use Topics  . Alcohol use: Yes   Current Outpatient Medications:  .  amphetamine-dextroamphetamine (ADDERALL XR) 30 MG 24 hr capsule, Take 30 mg by mouth daily., Disp: , Rfl:  .  clindamycin (CLINDAGEL) 1 % gel, Apply topically 2 (two) times daily., Disp: 30 g, Rfl: 5 .  famotidine (PEPCID) 10 MG tablet, Take 1 tablet (10 mg total) by mouth daily., Disp: 30 tablet, Rfl: 0 .  fluocinonide ointment (LIDEX) 0.05 %, APPLY TO AFFECTED AREA TWICE A DAY FOR 14 DAYS, Disp: , Rfl:  .  fluticasone (FLONASE) 50 MCG/ACT nasal spray, Place 2 sprays into both nostrils daily., Disp: 16 g, Rfl: 6 .  hydrOXYzine (ATARAX/VISTARIL) 50 MG tablet, Take 50 mg by mouth at bedtime., Disp: , Rfl:  .  minocycline (MINOCIN,DYNACIN) 100 MG capsule, Take 100 mg by mouth 2 (two) times daily., Disp: , Rfl:  .  Multiple Vitamin  (MULTIVITAMIN) tablet, Take 1 tablet by mouth daily., Disp: , Rfl:  .  Vilazodone HCl (VIIBRYD) 40 MG TABS, Take 20 mg by mouth daily. , Disp: , Rfl:   No Known Allergies  Objective:   VITALS: Per patient if applicable, see vitals. GENERAL: Alert, appears well and in no acute distress. HEENT: Atraumatic, conjunctiva clear, no obvious abnormalities on inspection of external nose and ears. NECK: Normal movements of the head and neck. CARDIOPULMONARY: No increased WOB. Speaking in clear sentences. I:E ratio WNL.  MS: Moves all visible extremities without noticeable abnormality. PSYCH: Pleasant and cooperative, well-groomed. Speech normal rate and rhythm. Affect is appropriate. Insight and judgement are appropriate. Attention is focused, linear, and appropriate.  NEURO: CN grossly intact. Oriented as arrived to appointment on time with no prompting. Moves both UE equally.  SKIN: No obvious lesions, wounds, erythema, or cyanosis noted on face or hands.  [facility] 01/17/2018 10/07/2017  Decreased Interest 2 0  Down, Depressed, Hopeless 2 0  PHQ - 2 Score 4 0  Altered sleeping 0 1  Tired, decreased energy 1 0  Change in appetite 0 0  Feeling bad or failure about yourself  2 0  Trouble concentrating 1 0  Moving slowly or fidgety/restless 0 3  Suicidal thoughts 0 0  PHQ-9 Score 8 4  Difficult doing work/chores Somewhat difficult Somewhat difficult    Assessment and Plan:   Julian Paul was seen today for follow-up.  Diagnoses and all orders for this visit:  Seasonal allergic rhinitis due to pollen -     ipratropium (ATROVENT) 0.03 % nasal spray; Place 2 sprays into both nostrils 2 (two) times daily. -     albuterol (VENTOLIN HFA) 108 (90 Base) MCG/ACT inhaler; Inhale 2 puffs into the lungs every 4 (four) hours as needed for wheezing or shortness of breath (cough, shortness of breath or wheezing.).  GAD (generalized anxiety disorder)  Mood disorder (HCC)  Gastroesophageal  reflux disease without esophagitis  Attention deficit hyperactivity disorder (ADHD), combined type   . COVID-19 Education: The signs and symptoms of COVID-19 were discussed with the patient and how to seek care for testing if needed. The importance of social distancing was discussed today. . Reviewed expectations re: course of current medical issues. . Discussed self-management of symptoms. . Outlined signs and symptoms indicating need for more acute intervention. . Patient verbalized understanding and all questions were answered. Marland Kitchen. Health Maintenance issues including appropriate healthy diet, exercise, and smoking avoidance were discussed with patient. . See orders for this visit as documented in the electronic medical record.  Helane RimaErica Aydenn Gervin, DO  Records requested if needed. Time spent: 25 minutes, of which >50% was spent in obtaining information about his symptoms, reviewing his previous labs, evaluations, and treatments, counseling him about his condition (please see the discussed topics above), and developing a plan to further investigate it; he had a number of questions which I addressed.

## 2018-10-31 ENCOUNTER — Ambulatory Visit (INDEPENDENT_AMBULATORY_CARE_PROVIDER_SITE_OTHER): Payer: BLUE CROSS/BLUE SHIELD | Admitting: Family Medicine

## 2018-10-31 VITALS — Ht 72.0 in

## 2018-10-31 DIAGNOSIS — J301 Allergic rhinitis due to pollen: Secondary | ICD-10-CM

## 2018-10-31 DIAGNOSIS — F411 Generalized anxiety disorder: Secondary | ICD-10-CM

## 2018-10-31 DIAGNOSIS — F39 Unspecified mood [affective] disorder: Secondary | ICD-10-CM

## 2018-10-31 DIAGNOSIS — K219 Gastro-esophageal reflux disease without esophagitis: Secondary | ICD-10-CM | POA: Diagnosis not present

## 2018-10-31 DIAGNOSIS — F902 Attention-deficit hyperactivity disorder, combined type: Secondary | ICD-10-CM

## 2018-10-31 MED ORDER — IPRATROPIUM BROMIDE 0.03 % NA SOLN: 2.0000 | Freq: Two times a day (BID) | NASAL | 1 refills | Status: DC

## 2018-10-31 MED ORDER — ALBUTEROL SULFATE HFA 108 (90 BASE) MCG/ACT IN AERS: 2.0000 | INHALATION_SPRAY | RESPIRATORY_TRACT | 1 refills | Status: AC | PRN

## 2018-11-01 ENCOUNTER — Encounter: Payer: Self-pay | Admitting: Family Medicine

## 2018-11-06 DIAGNOSIS — F3340 Major depressive disorder, recurrent, in remission, unspecified: Secondary | ICD-10-CM | POA: Diagnosis not present

## 2018-11-08 DIAGNOSIS — F3340 Major depressive disorder, recurrent, in remission, unspecified: Secondary | ICD-10-CM | POA: Diagnosis not present

## 2018-11-10 DIAGNOSIS — F3340 Major depressive disorder, recurrent, in remission, unspecified: Secondary | ICD-10-CM | POA: Diagnosis not present

## 2018-11-22 DIAGNOSIS — F3340 Major depressive disorder, recurrent, in remission, unspecified: Secondary | ICD-10-CM | POA: Diagnosis not present

## 2018-11-24 DIAGNOSIS — F3340 Major depressive disorder, recurrent, in remission, unspecified: Secondary | ICD-10-CM | POA: Diagnosis not present

## 2018-11-26 DIAGNOSIS — L853 Xerosis cutis: Secondary | ICD-10-CM | POA: Insufficient documentation

## 2018-11-28 DIAGNOSIS — F3340 Major depressive disorder, recurrent, in remission, unspecified: Secondary | ICD-10-CM | POA: Diagnosis not present

## 2018-12-01 DIAGNOSIS — F3340 Major depressive disorder, recurrent, in remission, unspecified: Secondary | ICD-10-CM | POA: Diagnosis not present

## 2018-12-06 DIAGNOSIS — F3340 Major depressive disorder, recurrent, in remission, unspecified: Secondary | ICD-10-CM | POA: Diagnosis not present

## 2018-12-07 DIAGNOSIS — F3340 Major depressive disorder, recurrent, in remission, unspecified: Secondary | ICD-10-CM | POA: Diagnosis not present

## 2018-12-13 DIAGNOSIS — F3340 Major depressive disorder, recurrent, in remission, unspecified: Secondary | ICD-10-CM | POA: Diagnosis not present

## 2018-12-17 ENCOUNTER — Encounter: Payer: Self-pay | Admitting: Family Medicine

## 2018-12-18 DIAGNOSIS — F3340 Major depressive disorder, recurrent, in remission, unspecified: Secondary | ICD-10-CM | POA: Diagnosis not present

## 2018-12-22 DIAGNOSIS — F3340 Major depressive disorder, recurrent, in remission, unspecified: Secondary | ICD-10-CM | POA: Diagnosis not present

## 2018-12-26 DIAGNOSIS — F3340 Major depressive disorder, recurrent, in remission, unspecified: Secondary | ICD-10-CM | POA: Diagnosis not present

## 2019-01-03 IMAGING — DX DG ABDOMEN 2V
3 series · 3 of 3 positions shown · non-contrast
Comparison: None.

CLINICAL DATA: Left flank pain

EXAM:
ABDOMEN - 2 VIEW

[abdomen standing ap]
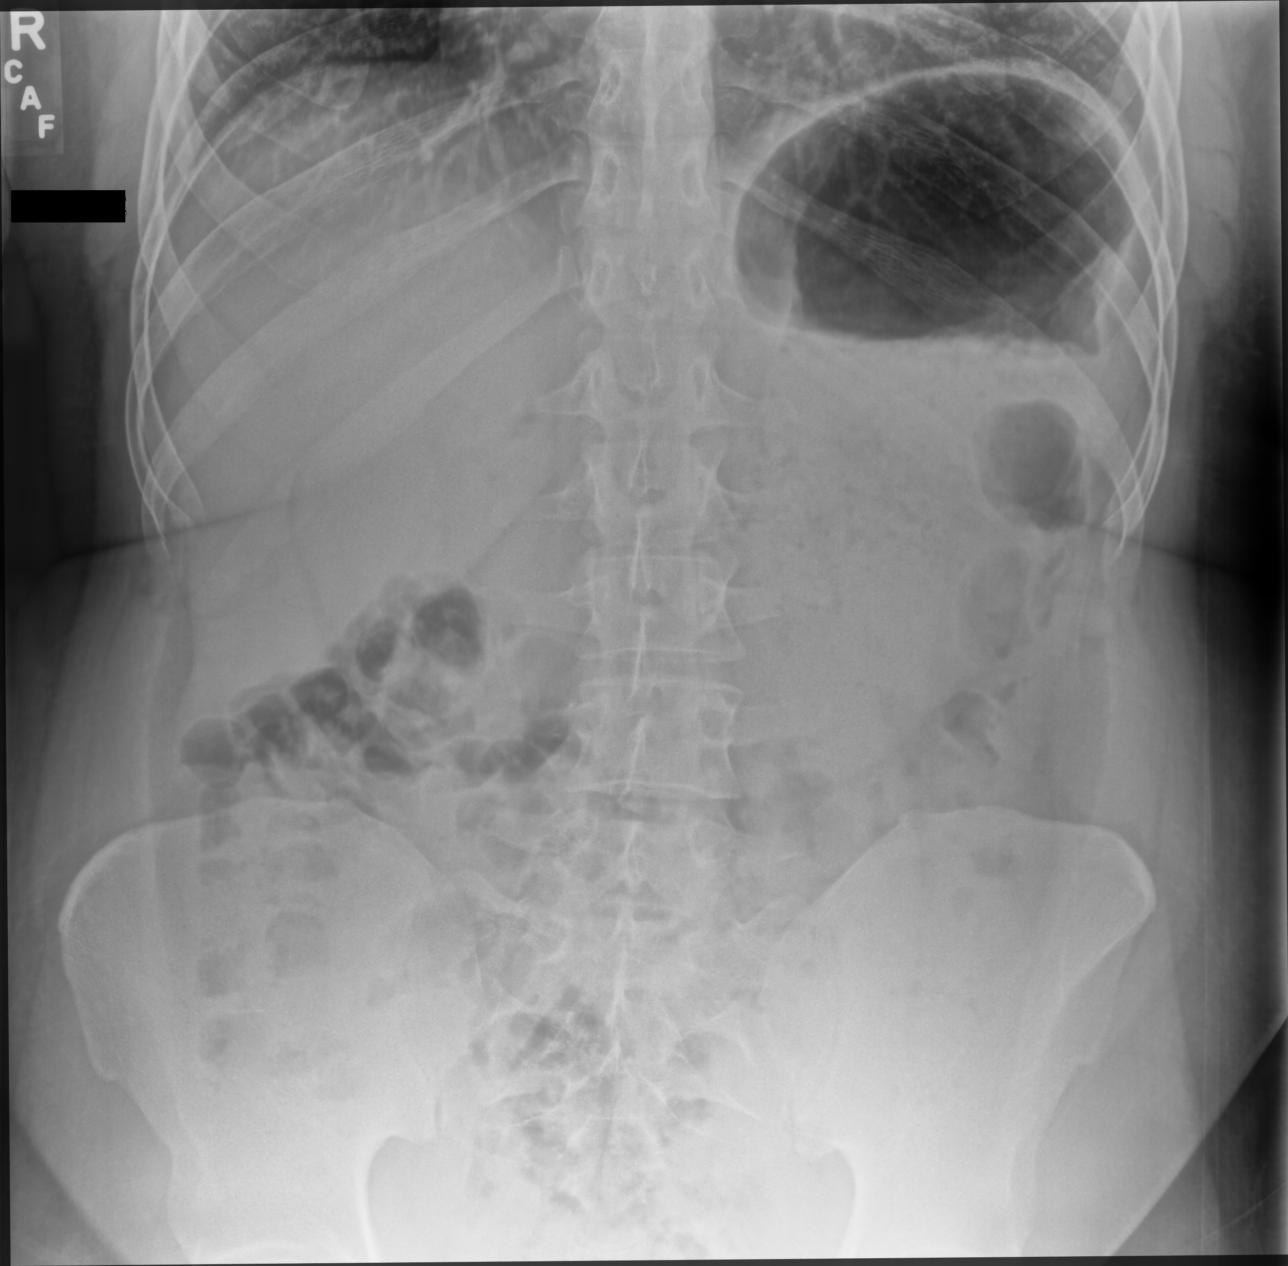

[kub ap (1 of 2)]
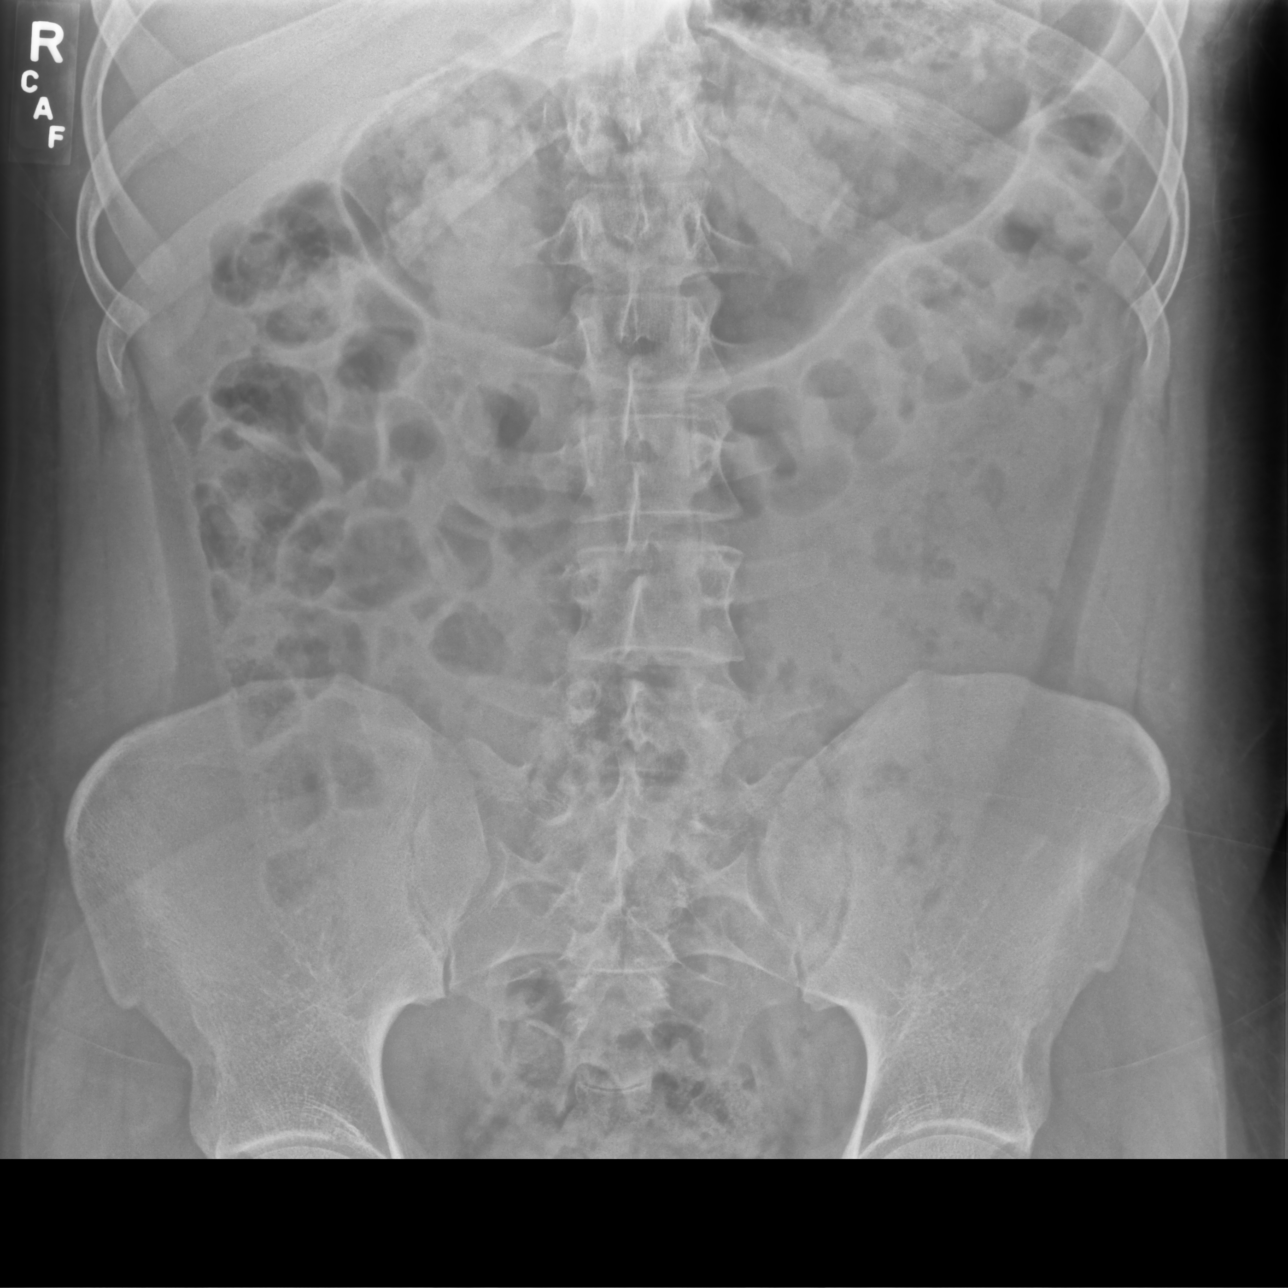

[kub ap (2 of 2)]
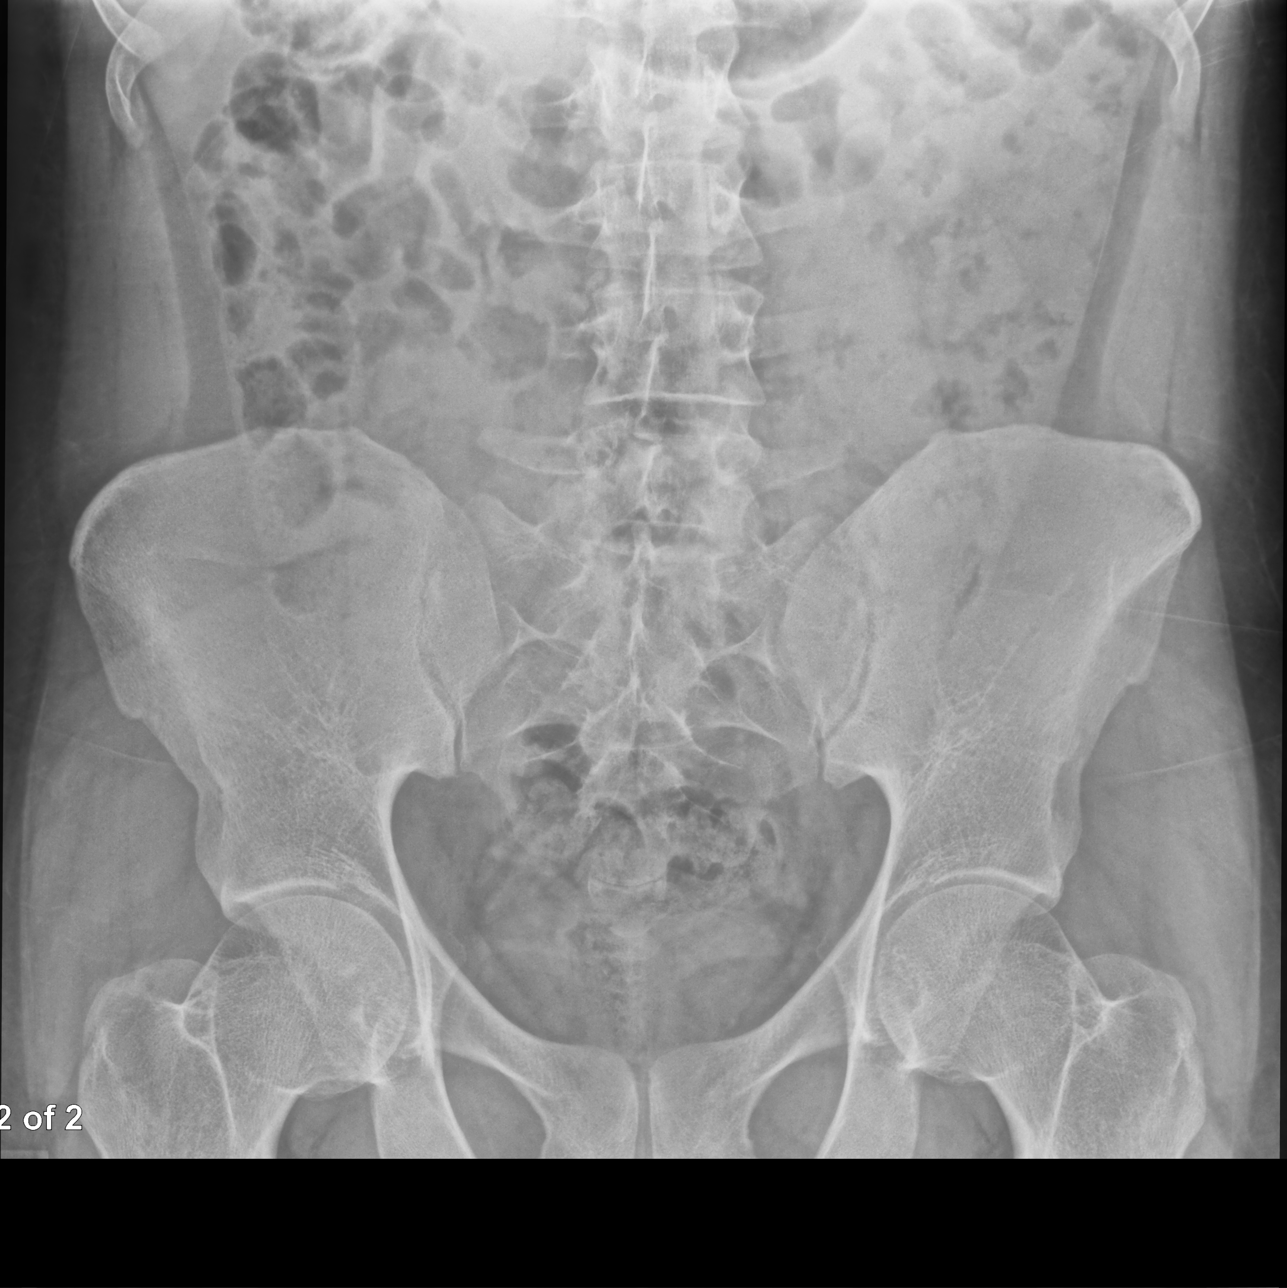

[3 of 3 positions shown; findings below may reference images not displayed]

FINDINGS: No free air beneath the diaphragm. Nonobstructed bowel-gas pattern
with moderate stool. No radiopaque calculi.
IMPRESSION: Negative.  Moderate stool in the colon

## 2019-01-04 DIAGNOSIS — F3340 Major depressive disorder, recurrent, in remission, unspecified: Secondary | ICD-10-CM | POA: Diagnosis not present

## 2019-01-05 DIAGNOSIS — F3340 Major depressive disorder, recurrent, in remission, unspecified: Secondary | ICD-10-CM | POA: Diagnosis not present

## 2019-01-08 DIAGNOSIS — F3340 Major depressive disorder, recurrent, in remission, unspecified: Secondary | ICD-10-CM | POA: Diagnosis not present

## 2019-01-18 DIAGNOSIS — F3340 Major depressive disorder, recurrent, in remission, unspecified: Secondary | ICD-10-CM | POA: Diagnosis not present

## 2019-01-22 DIAGNOSIS — F3340 Major depressive disorder, recurrent, in remission, unspecified: Secondary | ICD-10-CM | POA: Diagnosis not present

## 2019-01-23 ENCOUNTER — Encounter: Payer: Self-pay | Admitting: Family Medicine

## 2019-01-23 ENCOUNTER — Other Ambulatory Visit: Payer: Self-pay

## 2019-01-23 ENCOUNTER — Ambulatory Visit (INDEPENDENT_AMBULATORY_CARE_PROVIDER_SITE_OTHER): Payer: BC Managed Care – PPO | Admitting: Family Medicine

## 2019-01-23 VITALS — BP 118/62 | HR 100 | Temp 98.6°F | Ht 78.0 in | Wt 257.0 lb

## 2019-01-23 DIAGNOSIS — Z0000 Encounter for general adult medical examination without abnormal findings: Secondary | ICD-10-CM

## 2019-01-23 DIAGNOSIS — E8881 Metabolic syndrome: Secondary | ICD-10-CM | POA: Diagnosis not present

## 2019-01-23 DIAGNOSIS — Z13220 Encounter for screening for lipoid disorders: Secondary | ICD-10-CM | POA: Diagnosis not present

## 2019-01-23 DIAGNOSIS — M62838 Other muscle spasm: Secondary | ICD-10-CM

## 2019-01-23 DIAGNOSIS — Z79899 Other long term (current) drug therapy: Secondary | ICD-10-CM | POA: Diagnosis not present

## 2019-01-23 DIAGNOSIS — E88819 Insulin resistance, unspecified: Secondary | ICD-10-CM

## 2019-01-23 MED ORDER — BACLOFEN 20 MG PO TABS: 20.0000 mg | ORAL_TABLET | Freq: Two times a day (BID) | ORAL | 0 refills | Status: DC

## 2019-01-23 NOTE — Progress Notes (Signed)
Subjective:    Julian Paul is a 23 y.o. male who presents today for his Complete Annual Exam.    Current Outpatient Medications:  .  albuterol (VENTOLIN HFA) 108 (90 Base) MCG/ACT inhaler, Inhale 2 puffs into the lungs every 4 (four) hours as needed for wheezing or shortness of breath (cough, shortness of breath or wheezing.)., Disp: 1 Inhaler, Rfl: 1 .  clindamycin (CLINDAGEL) 1 % gel, Apply topically 2 (two) times daily., Disp: 30 g, Rfl: 5 .  famotidine (PEPCID) 10 MG tablet, Take 1 tablet (10 mg total) by mouth daily., Disp: 30 tablet, Rfl: 0 .  fluticasone (FLONASE) 50 MCG/ACT nasal spray, Place 2 sprays into both nostrils daily., Disp: 16 g, Rfl: 6 .  hydrOXYzine (ATARAX/VISTARIL) 50 MG tablet, Take 50 mg by mouth at bedtime., Disp: , Rfl:  .  ipratropium (ATROVENT) 0.03 % nasal spray, Place 2 sprays into both nostrils 2 (two) times daily., Disp: 30 mL, Rfl: 1 .  minocycline (MINOCIN,DYNACIN) 100 MG capsule, Take 100 mg by mouth 2 (two) times daily., Disp: , Rfl:  .  Multiple Vitamin (MULTIVITAMIN) tablet, Take 1 tablet by mouth daily., Disp: , Rfl:  .  Vilazodone HCl (VIIBRYD) 40 MG TABS, Take 20 mg by mouth daily. , Disp: , Rfl:  .  VYVANSE 40 MG capsule, Take 1 capsule by mouth daily. , Disp: , Rfl:  .  baclofen (LIORESAL) 20 MG tablet, Take 1 tablet (20 mg total) by mouth 2 (two) times daily., Disp: 30 each, Rfl: 0  Health Maintenance Due  Topic Date Due  . INFLUENZA VACCINE  01/13/2019    PMHx, SurgHx, SocialHx, Medications, and Allergies were reviewed in the Visit Navigator and updated as appropriate.   Past Medical History:  Diagnosis Date  . Adjustment disorder   . Anxiety   . Depression      Past Surgical History:  Procedure Laterality Date  . undescended testes       Family History  Problem Relation Age of Onset  . Cancer - Other Father 47       Gastric  . Colon cancer Maternal Grandmother 30    Social History   Tobacco Use  . Smoking status: Never  Smoker  . Smokeless tobacco: Never Used  Substance Use Topics  . Alcohol use: Yes  . Drug use: Not Currently    Review of Systems:   Pertinent items are noted in the HPI. Otherwise, ROS is negative.  Objective:   Vitals:   01/23/19 1350  BP: 118/62  Pulse: 100  Temp: 98.6 F (37 C)  SpO2: 98%   Body mass index is 29.7 kg/m.  General Appearance:  Alert, cooperative, no distress, appears stated age  Head:  Normocephalic, without obvious abnormality, atraumatic  Eyes:  PERRL, conjunctiva/corneas clear, EOM's intact, fundi benign, both eyes       Ears:  Normal TM's and external ear canals, both ears  Nose: Nares normal, septum midline, mucosa normal, no drainage    or sinus tenderness  Throat: Lips, mucosa, and tongue normal; teeth and gums normal  Neck: Supple, symmetrical, trachea midline, no adenopathy; thyroid:  No enlargement/tenderness/nodules; no carotit bruit or JVD  Back:   Symmetric, no curvature, ROM normal, no CVA tenderness  Lungs:   Clear to auscultation bilaterally, respirations unlabored  Chest wall:  No tenderness or deformity  Heart:  Regular rate and rhythm, S1 and S2 normal, no murmur, rub   or gallop  Abdomen:   Soft, non-tender,  bowel sounds active all four quadrants, no masses, no organomegaly  Extremities: Extremities normal, atraumatic, no cyanosis or edema  Prostate: Not done.   Skin: Skin color, texture, turgor normal, no rashes or lesions  Lymph nodes: Cervical, supraclavicular, and axillary nodes normal  Neurologic: CNII-XII grossly intact. Normal strength, sensation and reflexes throughout   Assessment/Plan:   Julian Paul was seen today for annual exam.  Diagnoses and all orders for this visit:  Routine physical examination  Insulin resistance -     Hemoglobin A1c  Medication management -     CBC with Differential/Platelet -     Comprehensive metabolic panel  Muscle spasms of neck -     baclofen (LIORESAL) 20 MG tablet; Take 1 tablet (20 mg  total) by mouth 2 (two) times daily.  Screening for lipid disorders -     Lipid panel    Patient Counseling: [x]   Nutrition: Stressed importance of moderation in sodium/caffeine intake, saturated fat and cholesterol, caloric balance, sufficient intake of fresh fruits, vegetables, and fiber.  [x]   Stressed the importance of regular exercise.   []   Substance Abuse: Discussed cessation/primary prevention of tobacco, alcohol, or other drug use; driving or other dangerous activities under the influence; availability of treatment for abuse.   [x]   Injury prevention: Discussed safety belts, safety helmets, smoke detector, smoking near bedding or upholstery.   []   Sexuality: Discussed sexually transmitted diseases, partner selection, use of condoms, avoidance of unintended pregnancy and contraceptive alternatives.   [x]   Dental health: Discussed importance of regular tooth brushing, flossing, and dental visits.  [x]   Health maintenance and immunizations reviewed. Please refer to Health maintenance section.    Helane RimaErica Brent Noto, DO  [facility]

## 2019-01-24 LAB — HEMOGLOBIN A1C: Hgb A1c MFr Bld: 5.8 % (ref 4.6–6.5)

## 2019-01-24 LAB — COMPREHENSIVE METABOLIC PANEL
ALT: 14 U/L (ref 0–53)
AST: 21 U/L (ref 0–37)
Albumin: 4.3 g/dL (ref 3.5–5.2)
Alkaline Phosphatase: 86 U/L (ref 39–117)
BUN: 12 mg/dL (ref 6–23)
CO2: 29 mEq/L (ref 19–32)
Calcium: 9.7 mg/dL (ref 8.4–10.5)
Chloride: 100 mEq/L (ref 96–112)
Creatinine, Ser: 1.06 mg/dL (ref 0.40–1.50)
GFR: 104.78 mL/min (ref >60.00)
Glucose, Bld: 88 mg/dL (ref 70–99)
Potassium: 3.8 mEq/L (ref 3.5–5.1)
Sodium: 138 mEq/L (ref 135–145)
Total Bilirubin: 0.4 mg/dL (ref 0.2–1.2)
Total Protein: 7.5 g/dL (ref 6.0–8.3)

## 2019-01-24 LAB — CBC WITH DIFFERENTIAL/PLATELET
Basophils Absolute: 0.1 10*3/uL (ref 0.0–0.1)
Basophils Relative: 1.2 % (ref 0.0–3.0)
Eosinophils Absolute: 0 10*3/uL (ref 0.0–0.7)
Eosinophils Relative: 0.6 % (ref 0.0–5.0)
HCT: 44.9 % (ref 39.0–52.0)
Hemoglobin: 15 g/dL (ref 13.0–17.0)
Lymphocytes Relative: 29.7 % (ref 12.0–46.0)
Lymphs Abs: 1.9 10*3/uL (ref 0.7–4.0)
MCHC: 33.4 g/dL (ref 30.0–36.0)
MCV: 85.4 fl (ref 78.0–100.0)
Monocytes Absolute: 0.7 10*3/uL (ref 0.1–1.0)
Monocytes Relative: 10.4 % (ref 3.0–12.0)
Neutro Abs: 3.7 10*3/uL (ref 1.4–7.7)
Neutrophils Relative %: 58.1 % (ref 43.0–77.0)
Platelets: 258 10*3/uL (ref 150.0–400.0)
RBC: 5.26 Mil/uL (ref 4.22–5.81)
RDW: 13.9 % (ref 11.5–15.5)
WBC: 6.3 10*3/uL (ref 4.0–10.5)

## 2019-01-24 LAB — LIPID PANEL
Cholesterol: 186 mg/dL (ref 0–200)
HDL: 52.2 mg/dL (ref >39.00)
LDL Cholesterol: 105 mg/dL — ABNORMAL HIGH (ref 0–99)
NonHDL: 133.31
Total CHOL/HDL Ratio: 4
Triglycerides: 141 mg/dL (ref 0.0–149.0)
VLDL: 28.2 mg/dL (ref 0.0–40.0)

## 2019-01-25 DIAGNOSIS — F3340 Major depressive disorder, recurrent, in remission, unspecified: Secondary | ICD-10-CM | POA: Diagnosis not present

## 2019-02-01 DIAGNOSIS — F3340 Major depressive disorder, recurrent, in remission, unspecified: Secondary | ICD-10-CM | POA: Diagnosis not present

## 2019-02-02 DIAGNOSIS — F3340 Major depressive disorder, recurrent, in remission, unspecified: Secondary | ICD-10-CM | POA: Diagnosis not present

## 2019-02-05 DIAGNOSIS — R81 Glycosuria: Secondary | ICD-10-CM | POA: Diagnosis not present

## 2019-02-05 DIAGNOSIS — E559 Vitamin D deficiency, unspecified: Secondary | ICD-10-CM | POA: Diagnosis not present

## 2019-02-05 LAB — BASIC METABOLIC PANEL
BUN: 13 (ref 4–21)
Creatinine: 1 (ref 0.6–1.3)
Glucose: 81
Potassium: 3.9 (ref 3.4–5.3)
Sodium: 138 (ref 137–147)

## 2019-02-05 LAB — VITAMIN D 25 HYDROXY (VIT D DEFICIENCY, FRACTURES): Vit D, 25-Hydroxy: 20.6

## 2019-02-13 ENCOUNTER — Other Ambulatory Visit: Payer: Self-pay

## 2019-02-13 DIAGNOSIS — R81 Glycosuria: Secondary | ICD-10-CM | POA: Insufficient documentation

## 2019-02-13 DIAGNOSIS — E559 Vitamin D deficiency, unspecified: Secondary | ICD-10-CM | POA: Insufficient documentation

## 2019-02-14 DIAGNOSIS — F3340 Major depressive disorder, recurrent, in remission, unspecified: Secondary | ICD-10-CM | POA: Diagnosis not present

## 2019-02-26 DIAGNOSIS — F3340 Major depressive disorder, recurrent, in remission, unspecified: Secondary | ICD-10-CM | POA: Diagnosis not present

## 2019-02-28 DIAGNOSIS — F3340 Major depressive disorder, recurrent, in remission, unspecified: Secondary | ICD-10-CM | POA: Diagnosis not present

## 2019-03-08 DIAGNOSIS — F3340 Major depressive disorder, recurrent, in remission, unspecified: Secondary | ICD-10-CM | POA: Diagnosis not present

## 2019-03-16 DIAGNOSIS — F3340 Major depressive disorder, recurrent, in remission, unspecified: Secondary | ICD-10-CM | POA: Diagnosis not present

## 2019-03-22 DIAGNOSIS — F3340 Major depressive disorder, recurrent, in remission, unspecified: Secondary | ICD-10-CM | POA: Diagnosis not present

## 2019-03-26 ENCOUNTER — Ambulatory Visit (INDEPENDENT_AMBULATORY_CARE_PROVIDER_SITE_OTHER): Payer: BC Managed Care – PPO

## 2019-03-26 ENCOUNTER — Encounter: Payer: Self-pay | Admitting: Family Medicine

## 2019-03-26 ENCOUNTER — Other Ambulatory Visit: Payer: Self-pay

## 2019-03-26 DIAGNOSIS — Z23 Encounter for immunization: Secondary | ICD-10-CM

## 2019-03-29 DIAGNOSIS — F3340 Major depressive disorder, recurrent, in remission, unspecified: Secondary | ICD-10-CM | POA: Diagnosis not present

## 2019-04-09 ENCOUNTER — Ambulatory Visit: Payer: Self-pay

## 2019-04-09 ENCOUNTER — Ambulatory Visit (HOSPITAL_COMMUNITY)
Admission: EM | Admit: 2019-04-09 | Discharge: 2019-04-09 | Disposition: A | Discharge status: Home / Self Care | Payer: BC Managed Care – PPO | Attending: Family Medicine | Admitting: Family Medicine

## 2019-04-09 ENCOUNTER — Ambulatory Visit (INDEPENDENT_AMBULATORY_CARE_PROVIDER_SITE_OTHER): Payer: BC Managed Care – PPO

## 2019-04-09 ENCOUNTER — Encounter (HOSPITAL_COMMUNITY): Payer: Self-pay

## 2019-04-09 ENCOUNTER — Other Ambulatory Visit: Payer: Self-pay

## 2019-04-09 DIAGNOSIS — R079 Chest pain, unspecified: Secondary | ICD-10-CM

## 2019-04-09 DIAGNOSIS — R002 Palpitations: Secondary | ICD-10-CM

## 2019-04-09 MED ORDER — ALUM & MAG HYDROXIDE-SIMETH 200-200-20 MG/5ML PO SUSP
ORAL | Status: AC
  Filled 2019-04-09: qty 30

## 2019-04-09 MED ORDER — OMEPRAZOLE 40 MG PO CPDR: 40.0000 mg | DELAYED_RELEASE_CAPSULE | Freq: Every day | ORAL | 0 refills | Status: DC

## 2019-04-09 MED ORDER — LIDOCAINE VISCOUS HCL 2 % MT SOLN
OROMUCOSAL | Status: AC
  Filled 2019-04-09: qty 15

## 2019-04-09 MED ORDER — ALUM & MAG HYDROXIDE-SIMETH 200-200-20 MG/5ML PO SUSP
30.0000 mL | Freq: Once | ORAL | Status: AC
  Administered 2019-04-09: 30 mL via ORAL

## 2019-04-09 MED ORDER — LIDOCAINE VISCOUS HCL 2 % MT SOLN
15.0000 mL | Freq: Once | OROMUCOSAL | Status: AC
  Administered 2019-04-09: 15 mL via ORAL

## 2019-04-09 NOTE — Telephone Encounter (Signed)
See note

## 2019-04-09 NOTE — ED Provider Notes (Signed)
[facility]    CSN: [id number] Arrival date & time: 04/09/19  1747      History   Chief Complaint Chief Complaint  Patient presents with  . Appointment  . Chest Pain    HPI Julian Paul is a 23 y.o. male.   Julian Paul presents with complaints of left sided chest aching, as well as recent palpitations. States symptoms started last week, he was unable to sleep even due to the palpitations at times. Worst on 10/22. No shortness of breath . No cough. Today he feels somewhat improved, especially palpitations, but feels an aching sensation to left chest. No epigastric pain or obvious gerd symptoms. History of sone gerd, however. Has been under increased stress as just had midterm exams in college, his car has mechanical issues, and he has baseline stress relating to caring for his family. He uses vistaril at night to help with sleep. He had been taking vyvanse, which was started earlier this year, as well as viibryd. He felt that the viibryd may be have been contributing to his symptoms therefore he stopped taking it two nights ago, feeling like symptoms have since improved. No injury to chest wall. No nausea. Denies any drug use. Minimal caffeine intake. Has been evaluated for chest pain in the past, approximately a year ago, in the ER. Otherwise denies any personal cardiac history. Denies any known family cardiac history. History  Of anxiety, depression, CARP    ROS per HPI, negative if not otherwise mentioned.      Past Medical History:  Diagnosis Date  . Adjustment disorder   . Anxiety   . Depression     Patient Active Problem List   Diagnosis Date Noted  . Vitamin D deficiency 02/13/2019  . Glucosuria 02/13/2019  . Dry skin 11/26/2018  . 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 GAD (generalized anxiety disorder) 04/12/2018  . Mood disorder (HCC) 04/12/2018  . Seborrheic dermatitis, unspecified 02/14/2018  . GERD (gastroesophageal reflux  disease) 04/28/2017  . Atopic eczema 12/29/2016  . Acne 06/26/2010    Past Surgical History:  Procedure Laterality Date  . undescended testes         Home Medications    Prior to Admission medications   Medication Sig Start Date End Date Taking? Authorizing Provider  albuterol (VENTOLIN HFA) 108 (90 Base) MCG/ACT inhaler Inhale 2 puffs into the lungs every 4 (four) hours as needed for wheezing or shortness of breath (cough, shortness of breath or wheezing.). 10/31/18   Helane Rima, DO  baclofen (LIORESAL) 20 MG tablet Take 1 tablet (20 mg total) by mouth 2 (two) times daily. 01/23/19   Helane Rima, DO  clindamycin (CLINDAGEL) 1 % gel Apply topically 2 (two) times daily. 11/24/17   Orland Mustard, MD  famotidine (PEPCID) 10 MG tablet Take 1 tablet (10 mg total) by mouth daily. 03/13/18   Jarold Motto, PA  fluticasone (FLONASE) 50 MCG/ACT nasal spray Place 2 sprays into both nostrils daily. 10/13/18   Helane Rima, DO  hydrOXYzine (ATARAX/VISTARIL) 50 MG tablet Take 50 mg by mouth at bedtime. 08/25/18   [provider]  hydrOXYzine (VISTARIL) 50 MG capsule TAKE 1 CAPSULE BY MOUTH EVERYDAY AT BEDTIME 01/13/19   [provider]  ipratropium (ATROVENT) 0.03 % nasal spray Place 2 sprays into both nostrils 2 (two) times daily. 10/31/18   Helane Rima, DO  Multiple Vitamin (MULTIVITAMIN) tablet Take 1 tablet by mouth daily.    [provider]  omeprazole (  PRILOSEC) 40 MG capsule Take 1 capsule (40 mg total) by mouth daily. 04/09/19   Zigmund Gottron, NP  propranolol (INDERAL) 20 MG tablet TAKE 1 TABLET EVERY 6 HOURS AS NEEDED FOR ANXIETY 11/29/18   [provider]  VIIBRYD 20 MG TABS TAKE 1 TABLET BY MOUTH EVERY DAY WITH A FULL MEAL 02/03/19   [provider]  VYVANSE 40 MG capsule Take 1 capsule by mouth daily.  10/26/18   [provider]    Family History Family History  Problem Relation Age of Onset  . Cancer - Other Father 38        Gastric  . Colon cancer Maternal Grandmother 38  . Renal cancer Maternal Grandmother   . Leukemia Maternal Grandmother   . Breast cancer Paternal Grandmother   . Breast cancer Maternal Aunt   . Prostate cancer Maternal Uncle     Social History Social History   Tobacco Use  . Smoking status: Never Smoker  . Smokeless tobacco: Never Used  Substance Use Topics  . Alcohol use: Yes    Comment: occ  . Drug use: Not Currently     Allergies   Patient has no known allergies.   Review of Systems Review of Systems   Physical Exam Triage Vital Signs ED Triage Vitals  Enc Vitals Group     BP 04/09/19 1808 134/80     Pulse Rate 04/09/19 1808 99     Resp 04/09/19 1808 18     Temp 04/09/19 1808 98.6 F (37 C)     Temp Source 04/09/19 1808 Oral     SpO2 04/09/19 1808 100 %     Weight --      Height --      Head Circumference --      Peak Flow --      Pain Score 04/09/19 1805 1     Pain Loc --      Pain Edu? --      Excl. in [place]? --    No data found.  Updated Vital Signs BP 134/80 (BP Location: Left Arm)   Pulse 99   Temp 98.6 F (37 C) (Oral)   Resp 18   SpO2 100%    Physical Exam Constitutional:      Appearance: He is well-developed.  Cardiovascular:     Rate and Rhythm: Normal rate and regular rhythm.  Pulmonary:     Effort: Pulmonary effort is normal.     Breath sounds: Normal breath sounds.  Chest:     Chest wall: No tenderness.  Abdominal:     Tenderness: There is no abdominal tenderness.  Skin:    General: Skin is warm and dry.  Neurological:     Mental Status: He is alert and oriented to person, place, and time.    EKG:  NSR . Previous EKG was available for review. No stwave changes as interpreted by me.    UC Treatments / Results  Labs (all labs ordered are listed, but only abnormal results are displayed) Labs Reviewed - No data to display  EKG   Radiology Dg Chest 2 View  Result Date: 04/09/2019 CLINICAL DATA:  23 year old male  with chest pain. EXAM: CHEST - 2 VIEW COMPARISON:  Chest radiograph dated 02/12/2018 FINDINGS: The heart size and mediastinal contours are within normal limits. Both lungs are clear. The visualized skeletal structures are unremarkable. IMPRESSION: No active cardiopulmonary disease. Electronically Signed   By: Anner Crete M.D.   On: 04/09/2019  19:10    Procedures Procedures (including critical care time)  Medications Ordered in UC Medications  alum & mag hydroxide-simeth (MAALOX/MYLANTA) 200-200-20 MG/5ML suspension 30 mL (30 mLs Oral Given 04/09/19 1905)    And  lidocaine (XYLOCAINE) 2 % viscous mouth solution 15 mL (15 mLs Oral Given 04/09/19 1905)  alum & mag hydroxide-simeth (MAALOX/MYLANTA) 200-200-20 MG/5ML suspension (has no administration in time range)  lidocaine (XYLOCAINE) 2 % viscous mouth solution (has no administration in time range)    Initial Impression / Assessment and Plan / UC Course  I have reviewed the triage vital signs and the nursing notes.  Pertinent labs & imaging results that were available during my care of the patient were reviewed by me and considered in my medical decision making (see chart for details).     ekg and chest xray reassuring. Non toxic in appearance, vitals stable here tonight. Stress/ anxiety vs medication side effect or combination of both considered and discussed. Discussed risk of sudden discontinuation of viibryd, that taper may be more appropriate. Question is vyvanse is more likely source of palpitations? Encouraged follow up with PCP for medication management and follow up. Return precautions provided. Patient verbalized understanding and agreeable to plan.    Final Clinical Impressions(s) / UC Diagnoses   Final diagnoses:  Nonspecific chest pain  Palpitations     Discharge Instructions     Your ekg and chest xray are reassuring here today.  It is possible that your medication may have caused some palpitations, please  continue to work with your primary care provider for management of your anxiety as well as palpitations as needed. You may continue to hold off on your medication but do check in again with who prescribed it as it may need to be tapered down to stop.  We will try omeprazole daily to see if this helps at all in managing reflux which may potentially be contributing.  Any worsening of symptoms please go to the Er.    ED Prescriptions    Medication Sig Dispense Auth. Provider   omeprazole (PRILOSEC) 40 MG capsule Take 1 capsule (40 mg total) by mouth daily. 30 capsule Georgetta HaberBurky, Hazelgrace Bonham B, NP     PDMP not reviewed this encounter.   Georgetta HaberBurky, Lithzy Bernard B, NP 04/09/19 2303

## 2019-04-09 NOTE — Discharge Instructions (Addendum)
Your ekg and chest xray are reassuring here today.  It is possible that your medication may have caused some palpitations, please continue to work with your primary care provider for management of your anxiety as well as palpitations as needed. You may continue to hold off on your medication but do check in again with who prescribed it as it may need to be tapered down to stop.  We will try omeprazole daily to see if this helps at all in managing reflux which may potentially be contributing.  Any worsening of symptoms please go to the Er.

## 2019-04-09 NOTE — ED Triage Notes (Signed)
Patient presents to Urgent Care with complaints of left sided chest pain since the past few days. Patient reports he recently had tachycardia and intense palpitations related to some medication (Vibrid for depression) he was taking. Pt states he got the medicine problem fixed but he began having the chest pain again recently, pt states it is mild this time.

## 2019-04-09 NOTE — Telephone Encounter (Signed)
Pt. Reports his Viibryd was recently increased and he has noticed palpitations and chest pain. Has chest pain now - pain 1/10. Palpitations will "last for hours." No availability this afternoon per [facility] in the practice. Pt. Will go to UC/ED for evaluation.  Answer Assessment - Initial Assessment Questions 1. DESCRIPTION: "Please describe your heart rate or heart beat that you are having" (e.g., fast/slow, regular/irregular, skipped or extra beats, "palpitations")     Palpitations 2. ONSET: "When did it start?" (Minutes, hours or days)      1-2 weeks ago 3. DURATION: "How long does it last" (e.g., seconds, minutes, hours)     Lasts for hours 4. PATTERN "Does it come and go, or has it been constant since it started?"  "Does it get worse with exertion?"   "Are you feeling it now?"     Comes and goes 5. TAP: "Using your hand, can you tap out what you are feeling on a chair or table in front of you, so that I can hear?" (Note: not all patients can do this)       No 6. HEART RATE: "Can you tell me your heart rate?" "How many beats in 15 seconds?"  (Note: not all patients can do this)       No 7. RECURRENT SYMPTOM: "Have you ever had this before?" If so, ask: "When was the last time?" and "What happened that time?"      Yes 8. CAUSE: "What do you think is causing the palpitations?"     My medicine - Vibrid 9. CARDIAC HISTORY: "Do you have any history of heart disease?" (e.g., heart attack, angina, bypass surgery, angioplasty, arrhythmia)      No 10. OTHER SYMPTOMS: "Do you have any other symptoms?" (e.g., dizziness, chest pain, sweating, difficulty breathing)       Chest pain - all day. Left side of chest. 1/10 11. PREGNANCY: "Is there any chance you are pregnant?" "When was your last menstrual period?"       n/a  Protocols used: HEART RATE AND HEARTBEAT QUESTIONS-A-AH

## 2019-04-10 NOTE — Telephone Encounter (Signed)
Agree with ED/UC dispo.  Algis Greenhouse. Jerline Pain, MD 04/10/2019 9:27 AM

## 2019-04-10 NOTE — Telephone Encounter (Signed)
FYI

## 2019-04-12 DIAGNOSIS — F3340 Major depressive disorder, recurrent, in remission, unspecified: Secondary | ICD-10-CM | POA: Diagnosis not present

## 2019-04-13 ENCOUNTER — Other Ambulatory Visit: Payer: Self-pay

## 2019-04-13 ENCOUNTER — Ambulatory Visit: Payer: Self-pay

## 2019-04-13 ENCOUNTER — Encounter: Payer: Self-pay | Admitting: Family Medicine

## 2019-04-13 ENCOUNTER — Ambulatory Visit (INDEPENDENT_AMBULATORY_CARE_PROVIDER_SITE_OTHER): Payer: BC Managed Care – PPO | Admitting: Family Medicine

## 2019-04-13 VITALS — Ht 78.0 in | Wt 257.0 lb

## 2019-04-13 DIAGNOSIS — R509 Fever, unspecified: Secondary | ICD-10-CM

## 2019-04-13 DIAGNOSIS — J029 Acute pharyngitis, unspecified: Secondary | ICD-10-CM | POA: Diagnosis not present

## 2019-04-13 DIAGNOSIS — R5383 Other fatigue: Secondary | ICD-10-CM | POA: Diagnosis not present

## 2019-04-13 DIAGNOSIS — Z20822 Contact with and (suspected) exposure to covid-19: Secondary | ICD-10-CM

## 2019-04-13 NOTE — Telephone Encounter (Signed)
Has app with you today  

## 2019-04-13 NOTE — Telephone Encounter (Signed)
See note

## 2019-04-13 NOTE — Patient Instructions (Signed)
There are no preventive care reminders to display for this patient.

## 2019-04-13 NOTE — Telephone Encounter (Signed)
Patient called stating that Tuesday 20th he went to be evaluated for chest tightness palpitations.at [facility]. He said they found nothing wrong. Today he states tht there is a funny sensation to his chest with tightness. He states that he allergy symptoms. He has a scratchy throat and he had fever type symptoms,sweats, last week. He works at [employer] and is with public all day. He states his job has been stressful. Care advice was read to patient and call was transferred to office for scheduling. Patient was informed that test are open and given hours of operation.  He verbalized understanding. Reason for Disposition . Chest pain or pressure  Answer Assessment - Initial Assessment Questions 1. LOCATION: "Where does it hurt?"       Left chest 2. RADIATION: "Does the pain go anywhere else?" (e.g., into neck, jaw, arms, back)     no 3. ONSET: "When did then chest pain begin?" (Minutes, hours or days)      Tuesday the 20 4. PATTERN "Does the pain come and go, or has it been constant since it started?"  "Does it get worse with exertion?"      Constant  5. DURATION: "How long does it last" (e.g., seconds, minutes, hours)     Always there sometimes it is forgetful 6. SEVERITY: "How bad is the pain?"  (e.g., Scale 1-10; mild, moderate, or severe)    - MILD (1-3): doesn't interfere with normal activities     - MODERATE (4-7): interferes with normal activities or awakens from sleep    - SEVERE (8-10): excruciating pain, unable to do any normal activities       2 7. CARDIAC RISK FACTORS: "Do you have any history of heart problems or risk factors for heart disease?" (e.g., angina, prior heart attack; diabetes, high blood pressure, high cholesterol, smoker, or strong family history of heart disease)     Borderline prediabetic,  8. PULMONARY RISK FACTORS: "Do you have any history of lung disease?"  (e.g., blood clots in lung, asthma, emphysema, birth control pills)     no 9. CAUSE: "What do you think is  causing the chest pain?"    Unsure  10. OTHER SYMPTOMS: "Do you have any other symptoms?" (e.g., dizziness, nausea, vomiting, sweating, fever, difficulty breathing, cough)       Sneezes allergy symptoms, sweats, fever like symptoms, this past week 11. PREGNANCY: "Is there any chance you are pregnant?" "When was your last menstrual period?"      N/A  Answer Assessment - Initial Assessment Questions 1. COVID-19 DIAGNOSIS: "Who made your Coronavirus (COVID-19) diagnosis?" "Was it confirmed by a positive lab test?" If not diagnosed by a HCP, ask "Are there lots of cases (community spread) where you live?" (See public health department website, if unsure)     [place] 2. ONSET: "When did the COVID-19 symptoms start?"      Last week 20 3. WORST SYMPTOM: "What is your worst symptom?" (e.g., cough, fever, shortness of breath, muscle aches)     Chest tightness mild throat irritation 4. COUGH: "Do you have a cough?" If so, ask: "How bad is the cough?"       Clears throat often 5. FEVER: "Do you have a fever?" If so, ask: "What is your temperature, how was it measured, and when did it start?"    Unsure but sweats 6. RESPIRATORY STATUS: "Describe your breathing?" (e.g., shortness of breath, wheezing, unable to speak)     no 7. BETTER-SAME-WORSE: "Are you getting better,  staying the same or getting worse compared to yesterday?"  If getting worse, ask, "In what way?"     same 8. HIGH RISK DISEASE: "Do you have any chronic medical problems?" (e.g., asthma, heart or lung disease, weak immune system, etc.)    no 9. PREGNANCY: "Is there any chance you are pregnant?" "When was your last menstrual period?"    N/A 10. OTHER SYMPTOMS: "Do you have any other symptoms?"  (e.g., chills, fatigue, headache, loss of smell or taste, muscle pain, sore throat)      Sore scratchy throat, chest tightness  Protocols used: CORONAVIRUS (COVID-19) DIAGNOSED OR SUSPECTED-A-AH, CHEST PAIN-A-AH

## 2019-04-13 NOTE — Progress Notes (Signed)
Phone [phone]  Subjective:  Virtual visit via Video note. Chief complaint: Chief Complaint  Patient presents with   Fever     This visit type was conducted due to national recommendations for restrictions regarding the COVID-19 Pandemic (e.g. social distancing).  This format is felt to be most appropriate for this patient at this time balancing risks to patient and risks to population by having him in for in person visit.  No physical exam was performed (except for noted visual exam or audio findings with Telehealth visits).    Our team/I connected with Julian Paul at  2:00 PM EDT by a video enabled telemedicine application (doxy.me or caregility through epic) and verified that I am speaking with the correct person using two identifiers.  Location patient: Home[place] Location provider: [facility], office Persons participating in the virtual visit:  patient  Our team/I discussed the limitations of evaluation and management by telemedicine and the availability of in person appointments. In light of current covid-19 pandemic, patient also understands that we are trying to protect them by minimizing in office contact if at all possible.  The patient expressed consent for telemedicine visit and agreed to proceed. Patient understands insurance will be billed.   ROS-no current shortness of breath.  Patient is resolved coming off [place].  Subjective fevers last week but not currently.  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 1 Inhaler 1   clindamycin (CLINDAGEL) 1 % gel Apply topically 2 (two) times daily. 30 g 5   famotidine (PEPCID) 10 MG tablet Take 1 tablet (10 mg total) by mouth daily. 30 tablet 0   fluticasone (FLONASE) 50 MCG/ACT nasal spray Place 2 sprays into both nostrils daily. 16 g 6   hydrOXYzine (VISTARIL) 50 MG capsule TAKE 1 CAPSULE BY MOUTH EVERYDAY AT BEDTIME     minocycline (MINOCIN) 100 MG capsule Take 100 mg by mouth 2 (two) times daily.     Multiple Vitamin (MULTIVITAMIN) tablet Take 1 tablet by mouth daily.     omeprazole (PRILOSEC) 40 MG capsule Take 1 capsule (40 mg total) by mouth daily. 30 capsule 0   VYVANSE 40 MG capsule Take 1 capsule by mouth daily.      ipratropium (ATROVENT) 0.03 % nasal spray Place 2 sprays into both nostrils 2 (two) times daily. (Patient not taking: Reported on 04/13/2019) 30 mL 1   No current facility-administered medications for this visit.      Objective:  Ht 6\' 6"  (1.981 m)    Wt 257 lb (116.6 kg)    BMI 29.70 kg/m  self reported vitals Gen: NAD, resting comfortably, no cough during visit Lungs: nonlabored, normal respiratory rate  Skin: appears dry, no obvious rash     Assessment and Plan   #Mild scratchy throat, sneezing, mild fatigue, mild chest tightness -  Prior more significant chest tightness and palpitations have improved S: Per nursing "patient called stating that Tuesday 20th he went to be evaluated for chest tightness palpitations.at [facility]. He said they found nothing wrong- CXR and EKG done. Today he states tht there is a funny sensation to his chest with tightness. He states that he allergy symptoms. He has a scratchy throat and he had fever type symptoms,sweats, last  week. He works at [employer] and is with public all day. He states his job has been stressful. Care advice was read to patient and call was transferred to office for scheduling. Patient was informed that test are open and given hours of operation.  He verbalized understanding."  Per patient today-He was worried symptoms were related to vibryyd- by Thursday night last week palpitations resolved when coming off medication.   Currently having very mild scratchy throat, some sneezing, mild fatigue but may be due to poor sleep lately. , perhaps mild chest tightness. No asthma diagnosis.   GF also really tired. GF also a patient of Dr. Earlene Plater.  She has a video visit planned to evaluate symptoms A/P: Current symptoms include mild scratchy throat, mild fatigue, mild chest tightness-last week also had temperature elevation/subjective fevers.  Patient with symptoms concerning for potential covid 19 versus other viral illness Therefore: - testing ordered for covid 19 and information provided on drive up testing  - recommended patient watch closely for shortness of breath or confusion or worsening symptoms and if those occur he should contact us immediately  -recommended self isolation until negative test  -Hopeful results back within 48 hours of test - work note written (see letter) -Conservative self-care at home with rest and good hydration  Thankful prior symptoms resolved off Viibryd but I did encourage psychiatry follow-up  Recommended follow up: As needed for acute issue-particular if symptoms worsen  Lab/Order associations:   ICD-10-CM   1. Subjective fever  R50.9 CANCELED: Novel Coronavirus, NAA ([facility])  2. Sore throat  J02.9   3. Fatigue, unspecified type  R53.83    Return precautions advised.  Tana Conch, MD

## 2019-04-15 LAB — NOVEL CORONAVIRUS, NAA: SARS-CoV-2, NAA: NOT DETECTED

## 2019-04-19 DIAGNOSIS — F3340 Major depressive disorder, recurrent, in remission, unspecified: Secondary | ICD-10-CM | POA: Diagnosis not present

## 2019-04-26 DIAGNOSIS — F3340 Major depressive disorder, recurrent, in remission, unspecified: Secondary | ICD-10-CM | POA: Diagnosis not present

## 2019-05-03 DIAGNOSIS — F3340 Major depressive disorder, recurrent, in remission, unspecified: Secondary | ICD-10-CM | POA: Diagnosis not present

## 2019-05-17 DIAGNOSIS — F3340 Major depressive disorder, recurrent, in remission, unspecified: Secondary | ICD-10-CM | POA: Diagnosis not present

## 2019-05-24 DIAGNOSIS — F3340 Major depressive disorder, recurrent, in remission, unspecified: Secondary | ICD-10-CM | POA: Diagnosis not present

## 2019-05-28 DIAGNOSIS — F3340 Major depressive disorder, recurrent, in remission, unspecified: Secondary | ICD-10-CM | POA: Diagnosis not present

## 2019-05-29 DIAGNOSIS — F3340 Major depressive disorder, recurrent, in remission, unspecified: Secondary | ICD-10-CM | POA: Diagnosis not present

## 2019-06-25 DIAGNOSIS — F3340 Major depressive disorder, recurrent, in remission, unspecified: Secondary | ICD-10-CM | POA: Diagnosis not present

## 2019-06-26 DIAGNOSIS — F3340 Major depressive disorder, recurrent, in remission, unspecified: Secondary | ICD-10-CM | POA: Diagnosis not present

## 2019-07-02 DIAGNOSIS — F3340 Major depressive disorder, recurrent, in remission, unspecified: Secondary | ICD-10-CM | POA: Diagnosis not present

## 2019-07-09 DIAGNOSIS — F3340 Major depressive disorder, recurrent, in remission, unspecified: Secondary | ICD-10-CM | POA: Diagnosis not present

## 2019-07-16 DIAGNOSIS — F3340 Major depressive disorder, recurrent, in remission, unspecified: Secondary | ICD-10-CM | POA: Diagnosis not present

## 2019-07-23 DIAGNOSIS — F3340 Major depressive disorder, recurrent, in remission, unspecified: Secondary | ICD-10-CM | POA: Diagnosis not present

## 2019-07-25 DIAGNOSIS — F3340 Major depressive disorder, recurrent, in remission, unspecified: Secondary | ICD-10-CM | POA: Diagnosis not present

## 2019-08-06 DIAGNOSIS — F3340 Major depressive disorder, recurrent, in remission, unspecified: Secondary | ICD-10-CM | POA: Diagnosis not present

## 2019-08-20 DIAGNOSIS — F3340 Major depressive disorder, recurrent, in remission, unspecified: Secondary | ICD-10-CM | POA: Diagnosis not present

## 2019-08-27 DIAGNOSIS — F900 Attention-deficit hyperactivity disorder, predominantly inattentive type: Secondary | ICD-10-CM | POA: Diagnosis not present

## 2019-08-27 DIAGNOSIS — F339 Major depressive disorder, recurrent, unspecified: Secondary | ICD-10-CM | POA: Diagnosis not present

## 2019-08-28 DIAGNOSIS — F339 Major depressive disorder, recurrent, unspecified: Secondary | ICD-10-CM | POA: Diagnosis not present

## 2019-08-28 DIAGNOSIS — F900 Attention-deficit hyperactivity disorder, predominantly inattentive type: Secondary | ICD-10-CM | POA: Diagnosis not present

## 2019-09-03 DIAGNOSIS — F900 Attention-deficit hyperactivity disorder, predominantly inattentive type: Secondary | ICD-10-CM | POA: Diagnosis not present

## 2019-09-03 DIAGNOSIS — F339 Major depressive disorder, recurrent, unspecified: Secondary | ICD-10-CM | POA: Diagnosis not present

## 2019-09-10 ENCOUNTER — Encounter: Payer: Self-pay | Admitting: Physician Assistant

## 2019-09-10 ENCOUNTER — Other Ambulatory Visit: Payer: Self-pay

## 2019-09-10 ENCOUNTER — Ambulatory Visit (INDEPENDENT_AMBULATORY_CARE_PROVIDER_SITE_OTHER): Payer: BC Managed Care – PPO | Admitting: Physician Assistant

## 2019-09-10 VITALS — BP 110/70 | HR 85 | Temp 97.7°F | Ht 78.0 in | Wt 262.0 lb

## 2019-09-10 DIAGNOSIS — F339 Major depressive disorder, recurrent, unspecified: Secondary | ICD-10-CM | POA: Diagnosis not present

## 2019-09-10 DIAGNOSIS — F411 Generalized anxiety disorder: Secondary | ICD-10-CM | POA: Diagnosis not present

## 2019-09-10 DIAGNOSIS — F900 Attention-deficit hyperactivity disorder, predominantly inattentive type: Secondary | ICD-10-CM | POA: Diagnosis not present

## 2019-09-10 NOTE — Patient Instructions (Signed)
It was great to see you!  Good luck in [place]!  Take care,  Jarold Motto PA-C

## 2019-09-10 NOTE — Progress Notes (Signed)
Julian Paul is a 24 y.o. male is here to discuss transfer of care.  I acted as a Education administrator for [employer], PA-C Anselmo Pickler, LPN  History of Present Illness:   Chief Complaint  Patient presents with  . Transfer of care    HPI   Pt is here for transfer of care from Dr. Juleen Paul.  Anxiety Pt following up, he is seeing a Psychiatrist Julian [place], PA for his anxiety. Pt is currently taking Pristiq 50 mg daily and Vistaril 50 mg at bedtime. Complaint with medications and he is feeling good. Pt is moving to [place] area. Denies SI/HI.    There are no preventive care reminders to display for this patient.  Past Medical History:  Diagnosis Date  . Adjustment disorder   . Anxiety   . Depression      Social History   Socioeconomic History  . Marital status: Significant Other    Spouse name: Not on file  . Number of children: Not on file  . Years of education: Not on file  . Highest education level: Not on file  Occupational History  . Not on file  Tobacco Use  . Smoking status: Never Smoker  . Smokeless tobacco: Never Used  Substance and Sexual Activity  . Alcohol use: Yes    Comment: occ  . Drug use: Not Currently  . Sexual activity: Not on file  Other Topics Concern  . Not on file  Social History Narrative   Moving to [place] and [place]   Social Determinants of Health   Financial Resource Strain:   . Difficulty of Paying Living Expenses:   Food Insecurity:   . Worried About Charity fundraiser in the Last Year:   . Arboriculturist in the Last Year:   Transportation Needs:   . Film/video editor (Medical):   Marland Kitchen Lack of Transportation (Non-Medical):   Physical Activity:   . Days of Exercise per Week:   . Minutes of Exercise per Session:   Stress:   . Feeling of Stress :   Social Connections:   . Frequency of Communication with Friends and Family:   . Frequency of Social Gatherings with Friends and Family:   . Attends Religious Services:   .  Active Member of Clubs or Organizations:   . Attends Archivist Meetings:   Marland Kitchen Marital Status:   Intimate Partner Violence:   . Fear of Current or Ex-Partner:   . Emotionally Abused:   Marland Kitchen Physically Abused:   . Sexually Abused:     Past Surgical History:  Procedure Laterality Date  . undescended testes      Family History  Problem Relation Age of Onset  . Cancer - Other Father 50       Gastric  . Colon cancer Maternal Grandmother 23  . Renal cancer Maternal Grandmother   . Leukemia Maternal Grandmother   . Breast cancer Paternal Grandmother   . Breast cancer Maternal Aunt   . Prostate cancer Maternal Uncle     PMHx, SurgHx, SocialHx, FamHx, Medications, and Allergies were reviewed in the Visit Navigator and updated as appropriate.   Patient Active Problem List   Diagnosis Date Noted  . Vitamin D deficiency 02/13/2019  . Glucosuria 02/13/2019  . Dry skin 11/26/2018  . 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 GAD (generalized anxiety disorder) 04/12/2018  . Mood disorder ([place]) 04/12/2018  . Seborrheic dermatitis, unspecified 02/14/2018  . GERD (gastroesophageal reflux  disease) 04/28/2017  . Atopic eczema 12/29/2016  . Acne 06/26/2010    Social History   Tobacco Use  . Smoking status: Never Smoker  . Smokeless tobacco: Never Used  Substance Use Topics  . Alcohol use: Yes    Comment: occ  . Drug use: Not Currently    Current Medications and Allergies:    Current Outpatient Medications:  .  albuterol (VENTOLIN HFA) 108 (90 Base) MCG/ACT inhaler, Inhale 2 puffs into the lungs every 4 (four) hours as needed for wheezing or shortness of breath (cough, shortness of breath or wheezing.)., Disp: 1 Inhaler, Rfl: 1 .  desvenlafaxine (PRISTIQ) 50 MG 24 hr tablet, Take 50 mg by mouth daily., Disp: , Rfl:  .  famotidine (PEPCID) 10 MG tablet, Take 1 tablet (10 mg total) by mouth daily. (Patient taking differently: Take 10 mg by mouth daily as  needed. ), Disp: 30 tablet, Rfl: 0 .  fluticasone (FLONASE) 50 MCG/ACT nasal spray, Place 2 sprays into both nostrils daily., Disp: 16 g, Rfl: 6 .  hydrOXYzine (VISTARIL) 50 MG capsule, TAKE 1 CAPSULE BY MOUTH EVERYDAY AT BEDTIME, Disp: , Rfl:  .  Vitamin D, Cholecalciferol, 50 MCG (2000 UT) CAPS, , Disp: , Rfl:  .  VYVANSE 50 MG capsule, Take 50 mg by mouth every morning., Disp: , Rfl:   No Known Allergies  Review of Systems   ROS  Negative unless otherwise specified per HPI.  Vitals:   Vitals:   09/10/19 1343  BP: 110/70  Pulse: 85  Temp: 97.7 F (36.5 C)  TempSrc: Temporal  SpO2: 98%  Weight: 262 lb (118.8 kg)  Height: 6\' 6"  (1.981 m)     Body mass index is 30.28 kg/m.   Physical Exam:    Physical Exam Vitals and nursing note reviewed.  Constitutional:      Appearance: He is well-developed.  HENT:     Head: Normocephalic.  Eyes:     Conjunctiva/sclera: Conjunctivae normal.     Pupils: Pupils are equal, round, and reactive to light.  Pulmonary:     Effort: Pulmonary effort is normal.  Musculoskeletal:        General: Normal range of motion.     Cervical back: Normal range of motion.  Skin:    General: Skin is warm and dry.  Neurological:     Mental Status: He is alert and oriented to person, place, and time.  Psychiatric:        Behavior: Behavior normal.        Thought Content: Thought content normal.        Judgment: Judgment normal.      Assessment and Plan:    Yordin was seen today for transfer of care.  Diagnoses and all orders for this visit:  GAD (generalized anxiety disorder)   Stable on regimen per psychiatrist -- Pristiq 50 mg daily, Vyvanse 50 mg daily, hydroxyzine 50 mg nighty. Follow-up as needed with Wille Glaser. Denies any acute concerns.  . Reviewed expectations re: course of current medical issues. . Discussed self-management of symptoms. . Outlined signs and symptoms indicating need for more acute intervention. . Patient verbalized  understanding and all questions were answered. . See orders for this visit as documented in the electronic medical record. . Patient received an After Visit Summary.  CMA or LPN served as scribe during this visit. History, Physical, and Plan performed by medical provider. The above documentation has been reviewed and is accurate and complete.  [place], PA-C [place],  [place] 09/10/2019  Follow-up: No follow-ups on file.

## 2019-09-12 DIAGNOSIS — F411 Generalized anxiety disorder: Secondary | ICD-10-CM | POA: Diagnosis not present

## 2019-09-12 DIAGNOSIS — F900 Attention-deficit hyperactivity disorder, predominantly inattentive type: Secondary | ICD-10-CM | POA: Diagnosis not present

## 2019-09-17 DIAGNOSIS — F900 Attention-deficit hyperactivity disorder, predominantly inattentive type: Secondary | ICD-10-CM | POA: Diagnosis not present

## 2019-09-17 DIAGNOSIS — F339 Major depressive disorder, recurrent, unspecified: Secondary | ICD-10-CM | POA: Diagnosis not present

## 2019-09-24 DIAGNOSIS — F339 Major depressive disorder, recurrent, unspecified: Secondary | ICD-10-CM | POA: Diagnosis not present

## 2019-10-18 DIAGNOSIS — F339 Major depressive disorder, recurrent, unspecified: Secondary | ICD-10-CM | POA: Diagnosis not present

## 2019-10-18 DIAGNOSIS — F900 Attention-deficit hyperactivity disorder, predominantly inattentive type: Secondary | ICD-10-CM | POA: Diagnosis not present

## 2019-10-18 DIAGNOSIS — F411 Generalized anxiety disorder: Secondary | ICD-10-CM | POA: Diagnosis not present

## 2019-10-22 DIAGNOSIS — F900 Attention-deficit hyperactivity disorder, predominantly inattentive type: Secondary | ICD-10-CM | POA: Diagnosis not present

## 2019-10-22 DIAGNOSIS — F339 Major depressive disorder, recurrent, unspecified: Secondary | ICD-10-CM | POA: Diagnosis not present

## 2019-11-23 DIAGNOSIS — F411 Generalized anxiety disorder: Secondary | ICD-10-CM | POA: Diagnosis not present

## 2019-11-23 DIAGNOSIS — F900 Attention-deficit hyperactivity disorder, predominantly inattentive type: Secondary | ICD-10-CM | POA: Diagnosis not present

## 2019-11-23 DIAGNOSIS — F339 Major depressive disorder, recurrent, unspecified: Secondary | ICD-10-CM | POA: Diagnosis not present

## 2019-11-27 DIAGNOSIS — F411 Generalized anxiety disorder: Secondary | ICD-10-CM | POA: Diagnosis not present

## 2019-11-27 DIAGNOSIS — F339 Major depressive disorder, recurrent, unspecified: Secondary | ICD-10-CM | POA: Diagnosis not present

## 2019-11-27 DIAGNOSIS — F900 Attention-deficit hyperactivity disorder, predominantly inattentive type: Secondary | ICD-10-CM | POA: Diagnosis not present

## 2019-11-29 DIAGNOSIS — F339 Major depressive disorder, recurrent, unspecified: Secondary | ICD-10-CM | POA: Diagnosis not present

## 2019-11-29 DIAGNOSIS — F900 Attention-deficit hyperactivity disorder, predominantly inattentive type: Secondary | ICD-10-CM | POA: Diagnosis not present

## 2019-11-29 DIAGNOSIS — F411 Generalized anxiety disorder: Secondary | ICD-10-CM | POA: Diagnosis not present

## 2019-11-30 DIAGNOSIS — F900 Attention-deficit hyperactivity disorder, predominantly inattentive type: Secondary | ICD-10-CM | POA: Diagnosis not present

## 2019-11-30 DIAGNOSIS — F411 Generalized anxiety disorder: Secondary | ICD-10-CM | POA: Diagnosis not present

## 2019-11-30 DIAGNOSIS — F339 Major depressive disorder, recurrent, unspecified: Secondary | ICD-10-CM | POA: Diagnosis not present

## 2019-12-08 DIAGNOSIS — K219 Gastro-esophageal reflux disease without esophagitis: Secondary | ICD-10-CM | POA: Diagnosis not present

## 2020-01-30 IMAGING — DX DG CHEST 2V
2 series · 2 of 2 positions shown · non-contrast
Comparison: Chest radiograph dated 02/12/2018

CLINICAL DATA: 23-year-old male with chest pain.

EXAM:
CHEST - 2 VIEW

[chest pa]
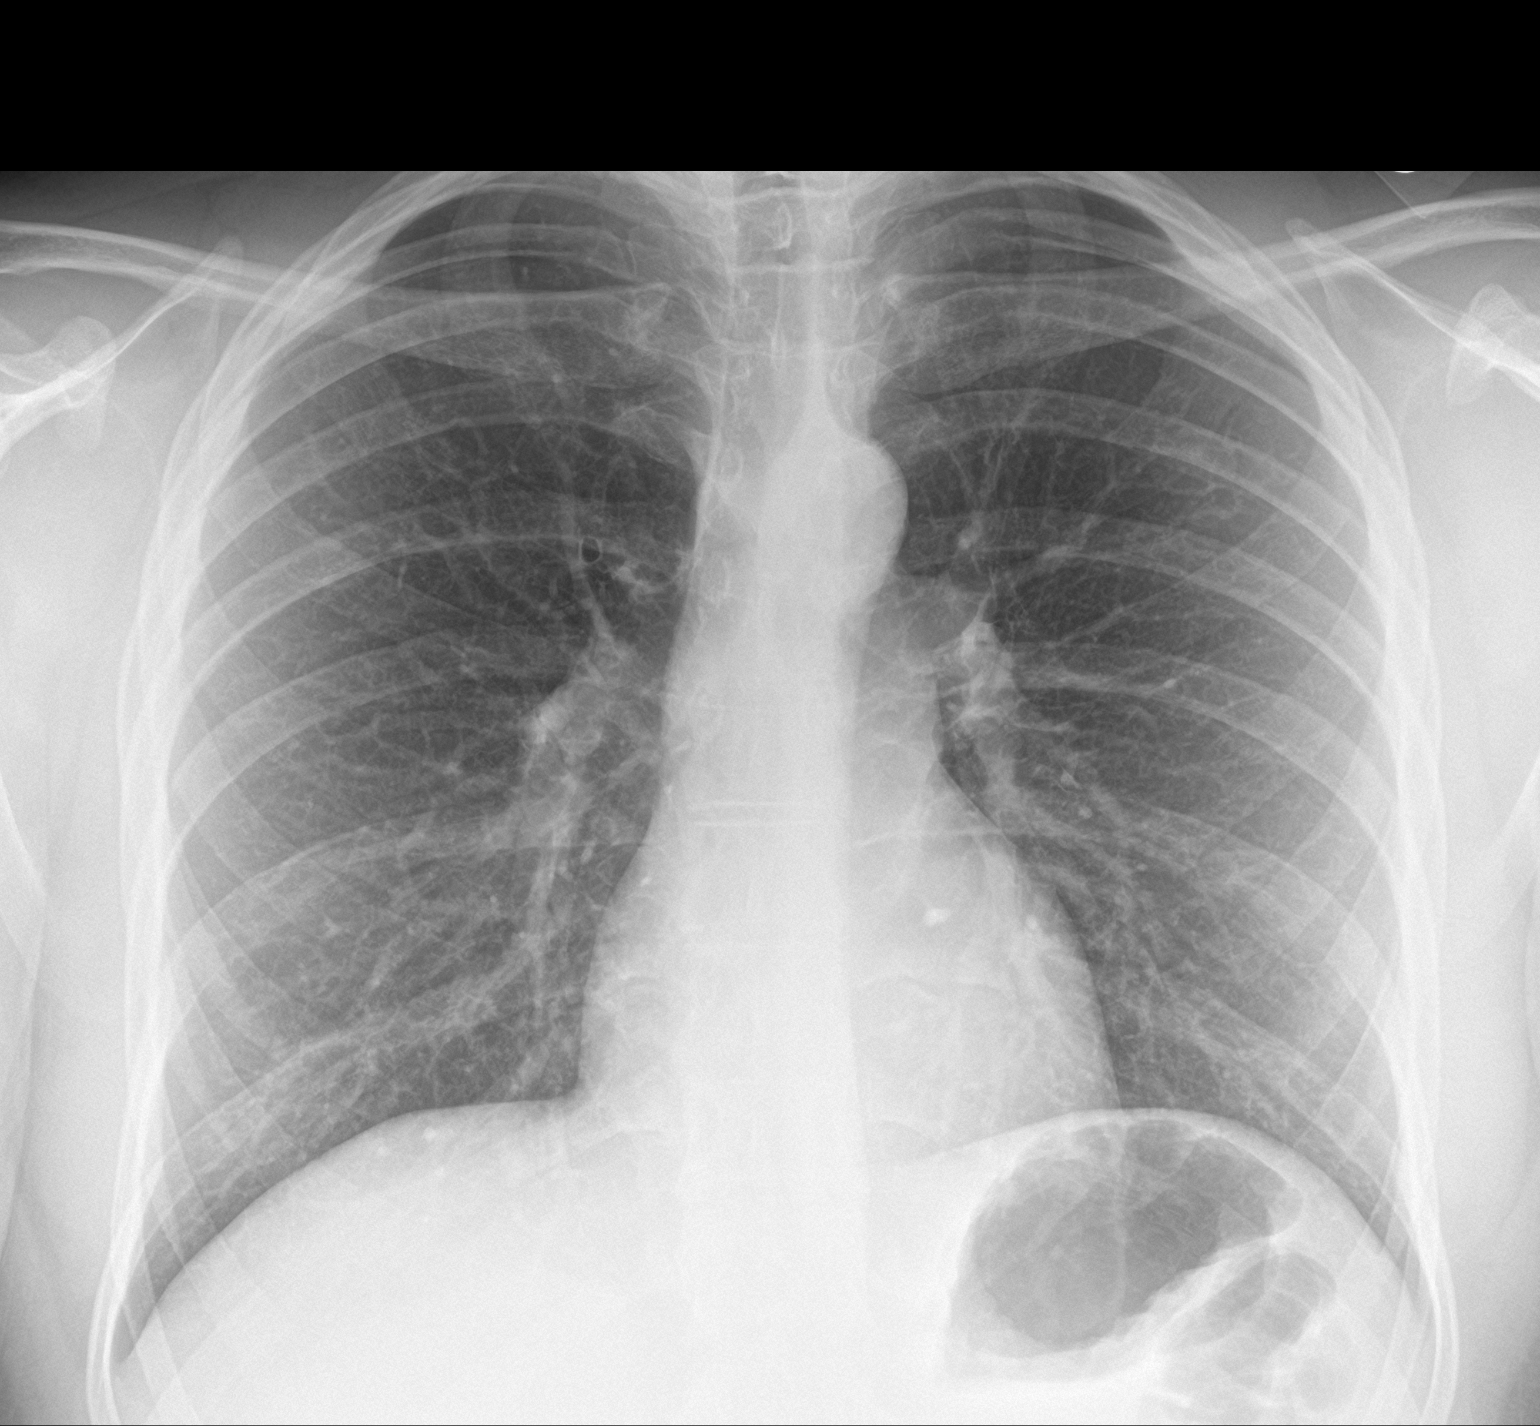

[chest lat]
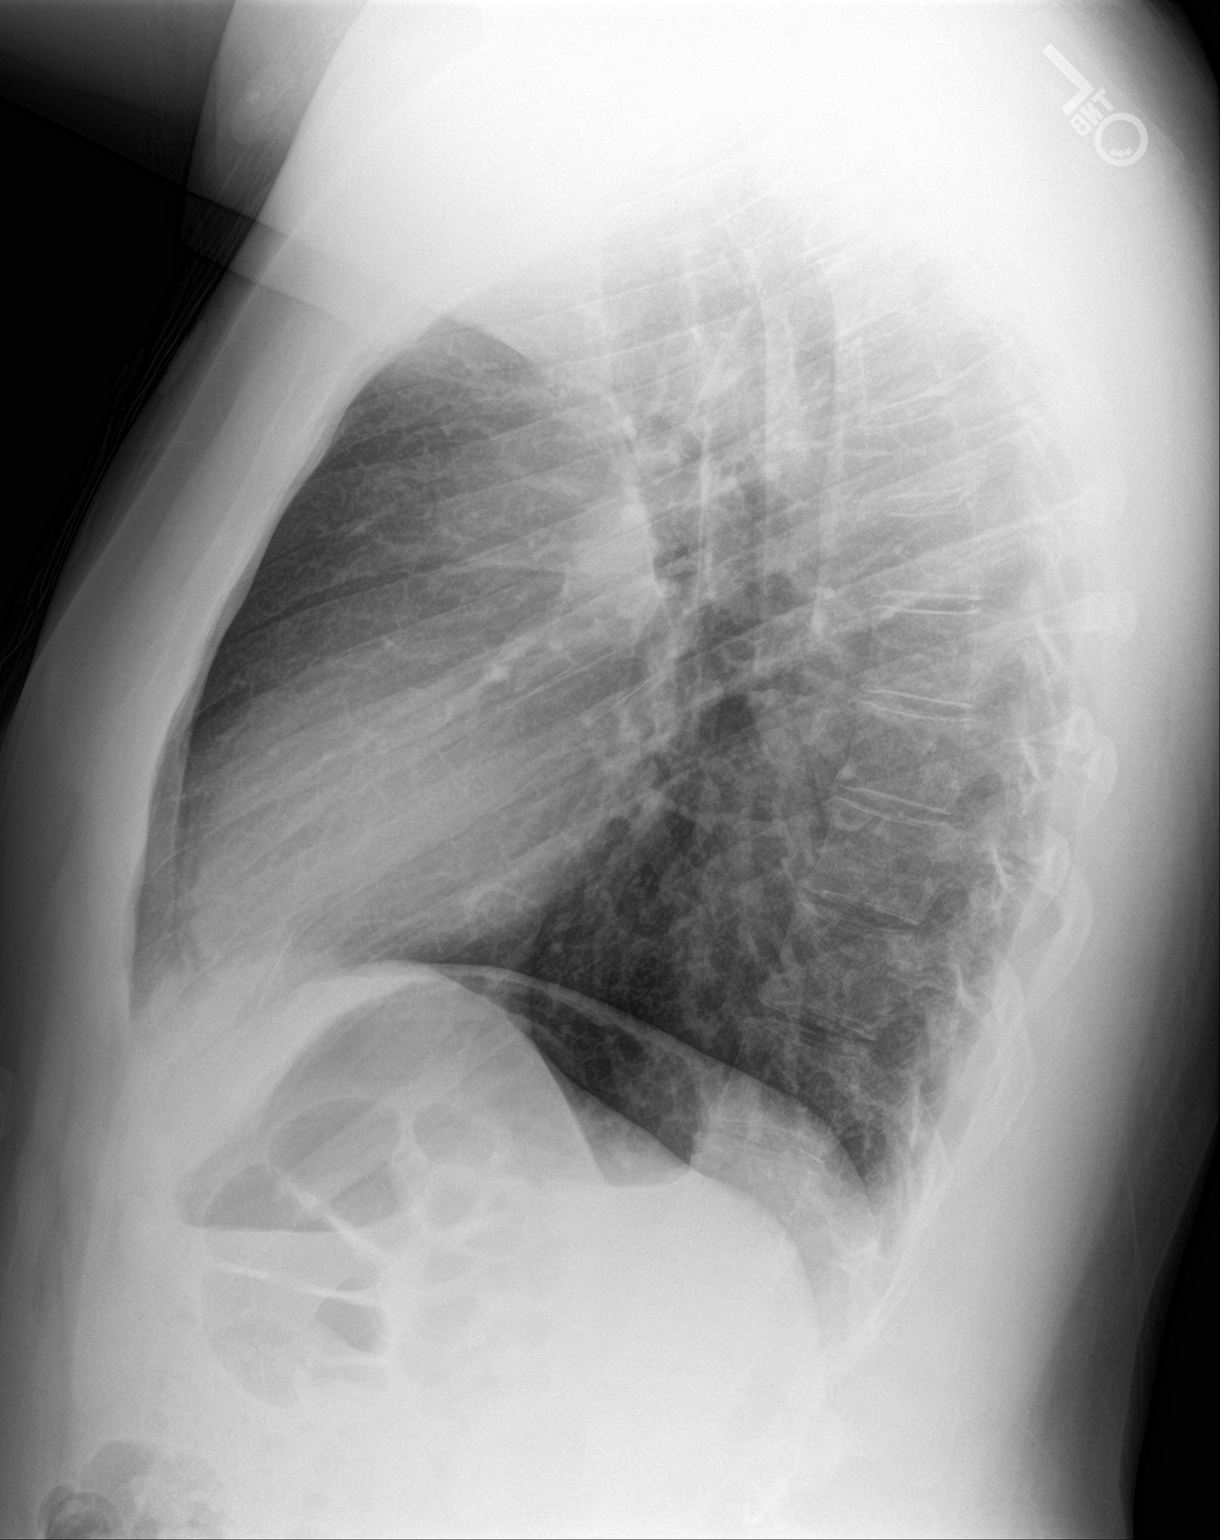

[2 of 2 positions shown; findings below may reference images not displayed]

FINDINGS: The heart size and mediastinal contours are within normal limits.
Both lungs are clear. The visualized skeletal structures are
unremarkable.
IMPRESSION: No active cardiopulmonary disease.

## 2020-06-25 ENCOUNTER — Other Ambulatory Visit: Payer: Self-pay | Admitting: Family Medicine

## 2021-04-16 ENCOUNTER — Emergency Department: Payer: BLUE CROSS/BLUE SHIELD

## 2021-04-16 ENCOUNTER — Emergency Department
Admission: EM | Admit: 2021-04-16 | Discharge: 2021-04-17 | Disposition: A | Discharge status: Home / Self Care | Payer: BLUE CROSS/BLUE SHIELD | Attending: Emergency Medicine | Admitting: Emergency Medicine

## 2021-04-16 DIAGNOSIS — S8001XA Contusion of right knee, initial encounter: Secondary | ICD-10-CM | POA: Insufficient documentation

## 2021-04-16 DIAGNOSIS — S39012A Strain of muscle, fascia and tendon of lower back, initial encounter: Secondary | ICD-10-CM | POA: Insufficient documentation

## 2021-04-16 DIAGNOSIS — W1830XA Fall on same level, unspecified, initial encounter: Secondary | ICD-10-CM | POA: Insufficient documentation

## 2021-04-16 DIAGNOSIS — S5002XA Contusion of left elbow, initial encounter: Secondary | ICD-10-CM | POA: Insufficient documentation

## 2021-04-16 HISTORY — DX: Acanthosis nigricans: L83

## 2021-04-16 HISTORY — DX: Adjustment disorder, unspecified: F43.20

## 2021-04-16 HISTORY — DX: Depression, unspecified: F32.A

## 2021-04-16 HISTORY — DX: Attention-deficit hyperactivity disorder, unspecified type: F90.9

## 2021-04-16 HISTORY — DX: Hidradenitis suppurativa: L73.2

## 2021-04-16 HISTORY — DX: Anxiety disorder, unspecified: F41.9

## 2021-04-16 NOTE — ED Provider Notes (Addendum)
EMERGENCY DEPARTMENT NOTE       HISTORY OF PRESENT ILLNESS   Patient initially seen and examined at   ED PHYSICIAN ASSIGNED       Date/Time Event User Comments    04/16/21 2315 Physician Assigned Norberto Sorenson. Norberto Sorenson, DO assigned as Attending                 History Provided By: patient  Chief Complaint: Fall     ADDITIONAL HX: Patient is a 25 year old male comes in for evaluation of possible injuries after fall.  Onset just prior arrival.  Course is fluctuating.  Location is right knee, left elbow, left scapular region and lower back.  Severity minimal.  Taking no pain meds prior to arrival.  Patient reportedly states his knees locked causing him to fall from a standing position.  Did not hit his head no loss of consciousness.    MEDICAL HISTORY     Past Medical History:  Past Medical History:   Diagnosis Date    ADHD     Adjustment disorder     Anxiety     Confluent and reticulated papillomatosis (CARP)     Depression     Hydradenitis        Past Surgical History:  Past Surgical History:   Procedure Laterality Date    ORCHIOPEXY         Social History:  Social History     Socioeconomic History    Marital status: Single   Tobacco Use    Smoking status: Never    Smokeless tobacco: Never   Vaping Use    Vaping Use: Never used   Substance and Sexual Activity    Alcohol use: Never    Drug use: Never       Family History:  Family History   Family history unknown: Yes       Outpatient Medication:  Previous Medications    DESVENLAFAXINE (PRISTIQ) 50 MG 24 HR TABLET    Take 50 mg by mouth daily    HYDROXYZINE (VISTARIL) 50 MG CAPSULE    hydroxyzine pamoate 50 mg capsule   TAKE 1 CAPSULE BY MOUTH EVERY DAY AS NEEDED FOR 90 DAYS    TRULICITY 0.75 MG/0.5ML SOLUTION PEN-INJECTOR    INJECT 0.75MG  SUBCUTANEOUSLY ONE TIME PER WEEK       REVIEW OF SYSTEMS   Review of Systems   Constitutional: Negative.    Respiratory: Negative.     Cardiovascular: Negative.    Gastrointestinal: Negative.    Genitourinary:  Negative.    Skin: Negative.    Neurological: Negative.    All other systems reviewed and are negative.     PHYSICAL EXAM     Vitals:    04/17/21 0026   BP: 135/68   Pulse: 67   Resp: 18   Temp:    SpO2: 98%       CONSTITUTIONAL:  No acute distress  HEAD:  atraumatic  ENT:  mucus membranes moist  NECK:  supple, trachea midline  CARDIOVASCULAR:  regular rate  PULMONARY/CHEST: no evidence of respiratory distress  ABDOMEN:  soft  MUSCULOSKELETAL: Right knee with no appreciable swelling.  Mild tenderness to the lateral joint line.  Extensor mechanism intact.  Left elbow has some mild swelling along olecranon along with tenderness.  He has full range of motion.  Back: Mild tenderness along the medial edge of the left scapula no midline tenderness.  Lower back without midline tenderness.  SKIN:  warm  and dry  NEURO:  AOX4  PSYCH:  appropriate behavior  MEDICAL DECISION MAKING          Vital Signs: Reviewed the patients vital signs.   Nursing Notes: Reviewed and utilized available nursing notes.  Medical Records Reviewed: Reviewed available past medical records.    Counseling: The emergency provider has spoken with the patient and discussed todays findings, in addition to providing specific details for the plan of care.  Questions are answered and there is agreement with the plan.                                              EMERGENCY IMAGING STUDIES   The following imagine studies were independently interpreted by me (emergency physician):  Radiology:  Exam interpreted by me (ED Physician)  Exam: R [id number]  Findings: No fracture. No Dislocation. No foreign body.  Impression: No acute abnormality.     Radiology:  Exam interpreted by me (ED Physician)  Exam: L [id number]  Findings: No fracture. No Dislocation. No foreign body.  Impression: No acute abnormality.                                     RADIOLOGY IMAGING STUDIES     Knee Right AP and Lateral (Limited)   Final Result       No acute bony changes are identified.        Ida Rogue, MD    04/17/2021 12:18 AM      Elbow Left AP Lateral And Obliques   Final Result       No acute bony changes are identified.       Ida Rogue, MD    04/17/2021 12:18 AM                                                       PULSE OXIMETRY   Oxygen Saturation by Pulse Oximetry: 98%  Interventions: none  Interpretation:  normal                                   EMERGENCY DEPT. MEDICATIONS     ED Medication Orders (From admission, onward)      Start Ordered     Status Ordering Provider    04/17/21 0024 04/17/21 0023  cyclobenzaprine (FLEXERIL) tablet 10 mg  Once        Route: Oral  Ordered Dose: 10 mg     Acknowledged Tiffaney Heimann M                                                      LABORATORY RESULTS   Ordered and independently interpreted AVAILABLE laboratory tests. Please see results section in chart for full details.  No results found for this or any previous visit.  MDM   DDX: fx, sprain, contusion, other    Each of the differential diagnosis has been considered for diagnosis and weighed risk and benefit of further testing and evaluation in the context of patient complaint. Some diagnosis do not warrant further testing including imaging and/or labs. However, all differential diagnosis have been considered.                                                     DIAGNOSIS     Diagnosis:  Final diagnoses:   Back strain, initial encounter   Contusion of right knee, initial encounter   Contusion of left elbow, initial encounter       Disposition:  ED Disposition       ED Disposition   Discharge    Condition   --    Date/Time   Fri Apr 17, 2021 12:24 AM    Comment   Rodena Piety discharge to home/self care.    Condition at disposition: Stable                 Prescriptions:  Patient's Medications   New Prescriptions    CYCLOBENZAPRINE (FLEXERIL) 10 MG TABLET    Take 1 tablet (10 mg) by mouth 3 (three) times daily as needed for Muscle spasms    Previous Medications    DESVENLAFAXINE (PRISTIQ) 50 MG 24 HR TABLET    Take 50 mg by mouth daily    HYDROXYZINE (VISTARIL) 50 MG CAPSULE    hydroxyzine pamoate 50 mg capsule   TAKE 1 CAPSULE BY MOUTH EVERY DAY AS NEEDED FOR 90 DAYS    TRULICITY 0.75 MG/0.5ML SOLUTION PEN-INJECTOR    INJECT 0.75MG  SUBCUTANEOUSLY ONE TIME PER WEEK   Modified Medications    No medications on file   Discontinued Medications    No medications on file       Based on the patients clinical presentation, vital signs, and diagnostic studies, the patient is safe for discharge. The work up has shown no signs of life-threatening emergency. The patient/family understands that follow up is needed, and if unable to do this - the patient/family should return to the ER for a repeat evaluation. Patient/family understands not all diagnosis can be excluded or found in the ED visit.        Norberto Sorenson, DO  04/17/21 0026       Norberto Sorenson, DO  04/17/21 0028

## 2021-04-17 ENCOUNTER — Emergency Department: Payer: BLUE CROSS/BLUE SHIELD

## 2021-04-17 MED ORDER — CYCLOBENZAPRINE HCL 10 MG PO TABS: 10.0000 mg | ORAL_TABLET | Freq: Three times a day (TID) | ORAL | 0 refills | Status: AC | PRN

## 2021-04-17 MED ORDER — CYCLOBENZAPRINE HCL 10 MG PO TABS
10.0000 mg | ORAL_TABLET | Freq: Once | ORAL | Status: AC
Start: 2021-04-17 — End: 2021-04-17
  Administered 2021-04-17: 10 mg via ORAL
  Filled 2021-04-17: qty 1

## 2021-04-17 NOTE — Discharge Instructions (Addendum)
Your xrays of your knee and elbow do not show any fractures

## 2022-03-08 ENCOUNTER — Encounter: Payer: Self-pay | Admitting: *Deleted

## 2022-05-27 ENCOUNTER — Encounter: Payer: Self-pay | Admitting: *Deleted
